# Patient Record
Sex: Female | Born: 1935 | Race: Black or African American | Hispanic: No | Smoking: Never smoker
Health system: Southern US, Community
[De-identification: ages and names within clinical notes are randomized; demographics above are authoritative.]

## PROBLEM LIST (undated history)

## (undated) DIAGNOSIS — I10 Essential (primary) hypertension: Secondary | ICD-10-CM

## (undated) DIAGNOSIS — E7800 Pure hypercholesterolemia, unspecified: Secondary | ICD-10-CM

---

## 2014-01-29 DIAGNOSIS — Z1231 Encounter for screening mammogram for malignant neoplasm of breast: Secondary | ICD-10-CM | POA: Diagnosis not present

## 2014-04-23 DIAGNOSIS — H2513 Age-related nuclear cataract, bilateral: Secondary | ICD-10-CM | POA: Diagnosis not present

## 2014-04-23 DIAGNOSIS — H524 Presbyopia: Secondary | ICD-10-CM | POA: Diagnosis not present

## 2014-04-23 DIAGNOSIS — H02834 Dermatochalasis of left upper eyelid: Secondary | ICD-10-CM | POA: Diagnosis not present

## 2014-04-23 DIAGNOSIS — H02831 Dermatochalasis of right upper eyelid: Secondary | ICD-10-CM | POA: Diagnosis not present

## 2014-04-23 DIAGNOSIS — H40023 Open angle with borderline findings, high risk, bilateral: Secondary | ICD-10-CM | POA: Diagnosis not present

## 2014-05-08 DIAGNOSIS — I10 Essential (primary) hypertension: Secondary | ICD-10-CM | POA: Diagnosis not present

## 2014-05-08 DIAGNOSIS — J9811 Atelectasis: Secondary | ICD-10-CM | POA: Diagnosis not present

## 2014-05-08 DIAGNOSIS — Z79899 Other long term (current) drug therapy: Secondary | ICD-10-CM | POA: Diagnosis not present

## 2014-05-08 DIAGNOSIS — R05 Cough: Secondary | ICD-10-CM | POA: Diagnosis not present

## 2014-05-08 DIAGNOSIS — J40 Bronchitis, not specified as acute or chronic: Secondary | ICD-10-CM | POA: Diagnosis not present

## 2014-05-08 DIAGNOSIS — E785 Hyperlipidemia, unspecified: Secondary | ICD-10-CM | POA: Diagnosis not present

## 2014-05-22 DIAGNOSIS — I10 Essential (primary) hypertension: Secondary | ICD-10-CM | POA: Diagnosis not present

## 2014-05-22 DIAGNOSIS — E782 Mixed hyperlipidemia: Secondary | ICD-10-CM | POA: Diagnosis not present

## 2014-05-22 DIAGNOSIS — R7309 Other abnormal glucose: Secondary | ICD-10-CM | POA: Diagnosis not present

## 2014-10-24 DIAGNOSIS — F064 Anxiety disorder due to known physiological condition: Secondary | ICD-10-CM | POA: Diagnosis not present

## 2014-10-24 DIAGNOSIS — Z0000 Encounter for general adult medical examination without abnormal findings: Secondary | ICD-10-CM | POA: Diagnosis not present

## 2014-10-24 DIAGNOSIS — R7309 Other abnormal glucose: Secondary | ICD-10-CM | POA: Diagnosis not present

## 2014-10-24 DIAGNOSIS — I10 Essential (primary) hypertension: Secondary | ICD-10-CM | POA: Diagnosis not present

## 2014-10-24 DIAGNOSIS — E782 Mixed hyperlipidemia: Secondary | ICD-10-CM | POA: Diagnosis not present

## 2014-11-19 DIAGNOSIS — Z83511 Family history of glaucoma: Secondary | ICD-10-CM | POA: Diagnosis not present

## 2014-11-19 DIAGNOSIS — H5203 Hypermetropia, bilateral: Secondary | ICD-10-CM | POA: Diagnosis not present

## 2014-11-19 DIAGNOSIS — H2513 Age-related nuclear cataract, bilateral: Secondary | ICD-10-CM | POA: Diagnosis not present

## 2014-11-19 DIAGNOSIS — H40023 Open angle with borderline findings, high risk, bilateral: Secondary | ICD-10-CM | POA: Diagnosis not present

## 2014-12-25 DIAGNOSIS — J40 Bronchitis, not specified as acute or chronic: Secondary | ICD-10-CM | POA: Diagnosis not present

## 2015-02-21 DIAGNOSIS — E785 Hyperlipidemia, unspecified: Secondary | ICD-10-CM | POA: Diagnosis not present

## 2015-02-21 DIAGNOSIS — I10 Essential (primary) hypertension: Secondary | ICD-10-CM | POA: Diagnosis not present

## 2015-04-24 DIAGNOSIS — Z79899 Other long term (current) drug therapy: Secondary | ICD-10-CM | POA: Diagnosis not present

## 2015-04-24 DIAGNOSIS — R05 Cough: Secondary | ICD-10-CM | POA: Diagnosis not present

## 2015-04-24 DIAGNOSIS — Z888 Allergy status to other drugs, medicaments and biological substances status: Secondary | ICD-10-CM | POA: Diagnosis not present

## 2015-04-24 DIAGNOSIS — I10 Essential (primary) hypertension: Secondary | ICD-10-CM | POA: Diagnosis not present

## 2015-04-24 DIAGNOSIS — J189 Pneumonia, unspecified organism: Secondary | ICD-10-CM | POA: Diagnosis not present

## 2015-04-27 ENCOUNTER — Emergency Department (HOSPITAL_COMMUNITY): Payer: Medicare Other

## 2015-04-27 ENCOUNTER — Emergency Department (HOSPITAL_COMMUNITY)
Admission: EM | Admit: 2015-04-27 | Discharge: 2015-04-27 | Disposition: A | Discharge status: Home / Self Care | Payer: Medicare Other | Attending: Emergency Medicine | Admitting: Emergency Medicine

## 2015-04-27 ENCOUNTER — Encounter (HOSPITAL_COMMUNITY): Payer: Self-pay

## 2015-04-27 DIAGNOSIS — R600 Localized edema: Secondary | ICD-10-CM | POA: Insufficient documentation

## 2015-04-27 DIAGNOSIS — J40 Bronchitis, not specified as acute or chronic: Secondary | ICD-10-CM | POA: Diagnosis not present

## 2015-04-27 DIAGNOSIS — R05 Cough: Secondary | ICD-10-CM | POA: Diagnosis not present

## 2015-04-27 LAB — BASIC METABOLIC PANEL
ANION GAP: 12 (ref 5–15)
BUN: 16 mg/dL (ref 6–20)
CALCIUM: 9.9 mg/dL (ref 8.9–10.3)
CO2: 21 mmol/L — AB (ref 22–32)
CREATININE: 0.93 mg/dL (ref 0.44–1.00)
Chloride: 101 mmol/L (ref 101–111)
GFR calc Af Amer: >60 mL/min (ref >60)
GFR, EST NON AFRICAN AMERICAN: 57 mL/min — AB (ref >60)
GLUCOSE: 117 mg/dL — AB (ref 65–99)
Potassium: 3.9 mmol/L (ref 3.5–5.1)
Sodium: 134 mmol/L — ABNORMAL LOW (ref 135–145)

## 2015-04-27 LAB — CBC
HEMATOCRIT: 45.1 % (ref 36.0–46.0)
Hemoglobin: 14.7 g/dL (ref 12.0–15.0)
MCH: 28.7 pg (ref 26.0–34.0)
MCHC: 32.6 g/dL (ref 30.0–36.0)
MCV: 87.9 fL (ref 78.0–100.0)
PLATELETS: 365 10*3/uL (ref 150–400)
RBC: 5.13 MIL/uL — ABNORMAL HIGH (ref 3.87–5.11)
RDW: 15.1 % (ref 11.5–15.5)
WBC: 6.7 10*3/uL (ref 4.0–10.5)

## 2015-04-27 LAB — I-STAT TROPONIN, ED: Troponin i, poc: 0.03 ng/mL (ref 0.00–0.08)

## 2015-04-27 MED ORDER — ALBUTEROL SULFATE HFA 108 (90 BASE) MCG/ACT IN AERS
2.0000 | INHALATION_SPRAY | RESPIRATORY_TRACT | Status: DC | PRN
  Administered 2015-04-27: 2 via RESPIRATORY_TRACT
  Filled 2015-04-27: qty 6.7

## 2015-04-27 NOTE — ED Notes (Signed)
Pt placed on 2L [place]

## 2015-04-27 NOTE — ED Notes (Addendum)
Pt here with c/o worsening symptoms after being diagnosed with "a touch of pneumonia" on Thursday at [facility]. She reports worsening cough. She was prescribed azithromycin and prednisone and states she has been taking them without relief. Pt denies having any pain but feels "maybe a discomfort" in her chest when she coughs.

## 2015-04-27 NOTE — ED Notes (Signed)
Pt and family verbalizes understanding of instructions.

## 2015-04-27 NOTE — ED Notes (Signed)
Pt ambulates in  Hallway with steadfy, easy gait and HR stays 90-95. Pulse oximetry remains 93-96% . Pt denies complaint or shortness of breath.

## 2015-04-27 NOTE — Discharge Instructions (Signed)

## 2015-04-27 NOTE — ED Provider Notes (Signed)
CSN: [id number]     Arrival date & time 04/27/15  1102 History   First MD Initiated Contact with Patient 04/27/15 1628     Chief Complaint  Patient presents with  . Pneumonia     (Consider location/radiation/quality/duration/timing/severity/associated sxs/prior Treatment) HPI This is a 81 year old female with report of diagnosis with pneumonia 3 days ago being cheated with Zithromax. She reports 2-1/2 weeks of congestion and cough with some subjective fever. She reports coughing up discolored sputum. She is somewhat dyspneic. Cough has not improved since she has been on medication. She has decreased appetite but is tolerating food and fluids. She reports taking the medication she was prescribed, but the Zithromax and prednisone. History reviewed. No pertinent past medical history. History reviewed. No pertinent past surgical history. No family history on file. Social History  Substance Use Topics  . Smoking status: Never Smoker   . Smokeless tobacco: None  . Alcohol Use: No   OB History    No data available     Review of Systems  All other systems reviewed and are negative.     Allergies  Review of patient's allergies indicates not on file.  Home Medications   Prior to Admission medications   Not on File   BP 125/78 mmHg  Pulse 114  Temp(Src) 99.3 F (37.4 C) (Oral)  Resp 25  SpO2 88% Physical Exam  Constitutional: She is oriented to person, place, and time. She appears well-developed and well-nourished.  HENT:  Head: Normocephalic and atraumatic.  Right Ear: External ear normal.  Left Ear: External ear normal.  Nose: Nose normal.  Mouth/Throat: Oropharynx is clear and moist.  Eyes: Conjunctivae and EOM are normal. Pupils are equal, round, and reactive to light.  Neck: Normal range of motion. Neck supple.  Cardiovascular: Normal rate, regular rhythm, normal heart sounds and intact distal pulses.   Pulmonary/Chest: Effort normal and breath sounds normal.   Abdominal: Soft. Bowel sounds are normal.  Musculoskeletal: Normal range of motion. She exhibits edema.  Trace edema bilaterally  Neurological: She is alert and oriented to person, place, and time. She has normal reflexes.  Skin: Skin is warm and dry.  Psychiatric: She has a normal mood and affect. Her behavior is normal. Judgment and thought content normal.  Nursing note and vitals reviewed.   ED Course  Procedures (including critical care time) Labs Review Labs Reviewed  BASIC METABOLIC PANEL - Abnormal; Notable for the following:    Sodium 134 (*)    CO2 21 (*)    Glucose, Bld 117 (*)    GFR calc non Af Amer 57 (*)    All other components within normal limits  CBC - Abnormal; Notable for the following:    RBC 5.13 (*)    All other components within normal limits  Rosezena Sensor, ED    Imaging Review Dg Chest 2 View  04/27/2015  CLINICAL DATA:  Worsening cough.  Pneumonia? EXAM: CHEST  2 VIEW COMPARISON:  None. FINDINGS: The heart size and mediastinal contours are within normal limits. Both lungs are clear. The visualized skeletal structures are unremarkable. IMPRESSION: No active cardiopulmonary disease.  No evidence of pneumonia seen. Electronically Signed   By: Bary Richard M.D.   On: 04/27/2015 13:21   I have personally reviewed and evaluated these images and lab results as part of my medical decision-making.   EKG Interpretation None      MDM   Final diagnoses:  Bronchitis    This is a 81 year old  female who presents stating that she was diagnosed with pneumonia total days ago. She has continued to cough but has not felt dyspneic and has not had a fever at home. Here her chest x-Serah Nicoletti is clear. There is a one-time sat of 88% noted from triage. On the pulse oximeter in the bed her sats of been in the upper 90s as high as 100%. She was ambulated and sats were between 90 and 95%. She's given an albuterol HFA and has decreased coughing here with this. Plan continue  Zithromax and home prednisone  Margarita Grizzleanielle Kassidie Hendriks, MD 04/27/15 [id number]

## 2015-05-14 DIAGNOSIS — Z1231 Encounter for screening mammogram for malignant neoplasm of breast: Secondary | ICD-10-CM | POA: Diagnosis not present

## 2015-05-22 DIAGNOSIS — H40023 Open angle with borderline findings, high risk, bilateral: Secondary | ICD-10-CM | POA: Diagnosis not present

## 2015-05-22 DIAGNOSIS — H2513 Age-related nuclear cataract, bilateral: Secondary | ICD-10-CM | POA: Diagnosis not present

## 2015-05-22 DIAGNOSIS — H02831 Dermatochalasis of right upper eyelid: Secondary | ICD-10-CM | POA: Diagnosis not present

## 2015-05-22 DIAGNOSIS — H25013 Cortical age-related cataract, bilateral: Secondary | ICD-10-CM | POA: Diagnosis not present

## 2015-05-22 DIAGNOSIS — H5203 Hypermetropia, bilateral: Secondary | ICD-10-CM | POA: Diagnosis not present

## 2015-06-26 DIAGNOSIS — H2511 Age-related nuclear cataract, right eye: Secondary | ICD-10-CM | POA: Diagnosis not present

## 2015-06-26 DIAGNOSIS — H2589 Other age-related cataract: Secondary | ICD-10-CM | POA: Diagnosis not present

## 2015-06-26 DIAGNOSIS — H25011 Cortical age-related cataract, right eye: Secondary | ICD-10-CM | POA: Diagnosis not present

## 2015-07-22 DIAGNOSIS — E785 Hyperlipidemia, unspecified: Secondary | ICD-10-CM | POA: Diagnosis not present

## 2015-07-22 DIAGNOSIS — I10 Essential (primary) hypertension: Secondary | ICD-10-CM | POA: Diagnosis not present

## 2015-07-22 DIAGNOSIS — R7309 Other abnormal glucose: Secondary | ICD-10-CM | POA: Diagnosis not present

## 2015-07-22 DIAGNOSIS — Z0000 Encounter for general adult medical examination without abnormal findings: Secondary | ICD-10-CM | POA: Diagnosis not present

## 2015-09-01 DIAGNOSIS — H5203 Hypermetropia, bilateral: Secondary | ICD-10-CM | POA: Diagnosis not present

## 2015-09-01 DIAGNOSIS — H25012 Cortical age-related cataract, left eye: Secondary | ICD-10-CM | POA: Diagnosis not present

## 2015-09-01 DIAGNOSIS — H2512 Age-related nuclear cataract, left eye: Secondary | ICD-10-CM | POA: Diagnosis not present

## 2015-09-17 DIAGNOSIS — H2011 Chronic iridocyclitis, right eye: Secondary | ICD-10-CM | POA: Diagnosis not present

## 2015-10-07 DIAGNOSIS — H2011 Chronic iridocyclitis, right eye: Secondary | ICD-10-CM | POA: Diagnosis not present

## 2015-10-07 DIAGNOSIS — H40051 Ocular hypertension, right eye: Secondary | ICD-10-CM | POA: Diagnosis not present

## 2015-10-14 DIAGNOSIS — H2011 Chronic iridocyclitis, right eye: Secondary | ICD-10-CM | POA: Diagnosis not present

## 2015-11-07 DIAGNOSIS — H2011 Chronic iridocyclitis, right eye: Secondary | ICD-10-CM | POA: Diagnosis not present

## 2015-11-19 DIAGNOSIS — I169 Hypertensive crisis, unspecified: Secondary | ICD-10-CM | POA: Diagnosis not present

## 2015-11-19 DIAGNOSIS — E782 Mixed hyperlipidemia: Secondary | ICD-10-CM | POA: Diagnosis not present

## 2015-11-19 DIAGNOSIS — Z79899 Other long term (current) drug therapy: Secondary | ICD-10-CM | POA: Diagnosis not present

## 2015-11-19 DIAGNOSIS — I10 Essential (primary) hypertension: Secondary | ICD-10-CM | POA: Diagnosis not present

## 2015-11-24 DIAGNOSIS — E782 Mixed hyperlipidemia: Secondary | ICD-10-CM | POA: Diagnosis not present

## 2015-11-24 DIAGNOSIS — I10 Essential (primary) hypertension: Secondary | ICD-10-CM | POA: Diagnosis not present

## 2015-11-24 DIAGNOSIS — I110 Hypertensive heart disease with heart failure: Secondary | ICD-10-CM | POA: Diagnosis not present

## 2015-12-02 DIAGNOSIS — H2011 Chronic iridocyclitis, right eye: Secondary | ICD-10-CM | POA: Diagnosis not present

## 2015-12-02 DIAGNOSIS — I119 Hypertensive heart disease without heart failure: Secondary | ICD-10-CM | POA: Diagnosis not present

## 2015-12-02 DIAGNOSIS — I110 Hypertensive heart disease with heart failure: Secondary | ICD-10-CM | POA: Diagnosis not present

## 2015-12-03 DIAGNOSIS — I110 Hypertensive heart disease with heart failure: Secondary | ICD-10-CM | POA: Diagnosis not present

## 2015-12-11 DIAGNOSIS — R350 Frequency of micturition: Secondary | ICD-10-CM | POA: Diagnosis not present

## 2015-12-17 DIAGNOSIS — H2011 Chronic iridocyclitis, right eye: Secondary | ICD-10-CM | POA: Diagnosis not present

## 2015-12-17 DIAGNOSIS — H2512 Age-related nuclear cataract, left eye: Secondary | ICD-10-CM | POA: Diagnosis not present

## 2015-12-22 DIAGNOSIS — I110 Hypertensive heart disease with heart failure: Secondary | ICD-10-CM | POA: Diagnosis not present

## 2015-12-22 DIAGNOSIS — G5601 Carpal tunnel syndrome, right upper limb: Secondary | ICD-10-CM | POA: Diagnosis not present

## 2015-12-22 DIAGNOSIS — E782 Mixed hyperlipidemia: Secondary | ICD-10-CM | POA: Diagnosis not present

## 2016-01-03 ENCOUNTER — Emergency Department (HOSPITAL_COMMUNITY)
Admission: EM | Admit: 2016-01-03 | Discharge: 2016-01-03 | Disposition: A | Discharge status: Home / Self Care | Payer: Medicare Other | Attending: Emergency Medicine | Admitting: Emergency Medicine

## 2016-01-03 ENCOUNTER — Encounter (HOSPITAL_COMMUNITY): Payer: Self-pay

## 2016-01-03 DIAGNOSIS — R350 Frequency of micturition: Secondary | ICD-10-CM | POA: Insufficient documentation

## 2016-01-03 LAB — URINALYSIS, ROUTINE W REFLEX MICROSCOPIC
Bilirubin Urine: NEGATIVE
Glucose, UA: NEGATIVE mg/dL
Hgb urine dipstick: NEGATIVE
Ketones, ur: NEGATIVE mg/dL
Nitrite: NEGATIVE
Protein, ur: NEGATIVE mg/dL
Specific Gravity, Urine: 1.023 (ref 1.005–1.030)
pH: 5 (ref 5.0–8.0)

## 2016-01-03 LAB — CBG MONITORING, ED: Glucose-Capillary: 124 mg/dL — ABNORMAL HIGH (ref 65–99)

## 2016-01-03 MED ORDER — MIRABEGRON ER 50 MG PO TB24: 50.0000 mg | ORAL_TABLET | Freq: Every day | ORAL | 0 refills | Status: DC

## 2016-01-03 NOTE — ED Triage Notes (Signed)
Patient here with urinary frequency fr several days. Denies dysuria, denies cloudy urine. No distress

## 2016-01-03 NOTE — ED Provider Notes (Signed)
[facility]-EMERGENCY DEPT Provider Note   CSN: [id number] Arrival date & time: 01/03/16  1244  By signing my name below, I, Rosario AdieWilliam Andrew Hiatt, attest that this documentation has been prepared under the direction and in the presence of Raeford RazorStephen Fatuma Dowers, MD. Electronically Signed: Rosario AdieWilliam Andrew Hiatt, ED Scribe. 01/03/16. 2:12 PM.  History   Chief Complaint Chief Complaint  Patient presents with  . Urinary Frequency   The history is provided by the patient. No language interpreter was used.    HPI Comments: Kelly Dodson is a 81 y.o. female with a PMHx of CHF, who presents to the Emergency Department complaining of gradually worsening urinary frequency onset approximately 2 months ago. She notes that she has been having urine voids every two hours, prompting her to come into the ED. She was seen by her PCP for her symptoms, at that time her diuretic medications were altered. Pt has been taking these new medications without relief and she notes that they have now made her symptoms worse. There are no other associated symptoms or complaints. She denies dysuria, hematuria, abdominal pain, or any other associated symptoms.   PCP/Cardiologist: Geraldo PitterBLAND,VEITA J, MD  History reviewed. No pertinent past medical history.  There are no active problems to display for this patient.  History reviewed. No pertinent surgical history.  OB History    No data available     Home Medications    Prior to Admission medications   Medication Sig Start Date End Date Taking? Authorizing Provider  amLODipine (NORVASC) 10 MG tablet Take 10 mg by mouth daily. 06/25/04   Historical Provider, MD  azithromycin (ZITHROMAX) 250 MG tablet Take 250 mg by mouth daily. 04/24/15   Historical Provider, MD  bumetanide (BUMEX) 0.5 MG tablet Take 0.5 mg by mouth daily.    Historical Provider, MD  DM-Doxylamine-Acetaminophen (NYQUIL COLD & FLU PO) Take 2 capsules by mouth daily as needed (FOR COLD).    Historical Provider, MD    ezetimibe (ZETIA) 10 MG tablet Take 10 mg by mouth daily. 06/01/04   Historical Provider, MD  guaiFENesin (MUCINEX) 600 MG 12 hr tablet Take 600 mg by mouth 2 (two) times daily as needed for cough or to loosen phlegm.    Historical Provider, MD  Multiple Vitamin (MULTIVITAMIN WITH MINERALS) TABS tablet Take 1 tablet by mouth daily.    Historical Provider, MD  predniSONE (DELTASONE) 10 MG tablet Take 10 mg by mouth daily. 04/24/15   Historical Provider, MD   Family History No family history on file.  Social History Social History  Substance Use Topics  . Smoking status: Never Smoker  . Smokeless tobacco: Not on file  . Alcohol use No   Allergies   Patient has no known allergies.  Review of Systems Review of Systems  Constitutional: Negative for fever.  Gastrointestinal: Negative for abdominal pain.  Genitourinary: Positive for frequency. Negative for difficulty urinating, dysuria, hematuria and urgency.  Psychiatric/Behavioral: Negative for confusion.  All other systems reviewed and are negative.  Physical Exam Updated Vital Signs BP 142/88 (BP Location: Right Arm)   Pulse 107   Temp 98.1 F (36.7 C) (Oral)   Resp 18   SpO2 94%   Physical Exam  Constitutional: She appears well-developed and well-nourished.  HENT:  Head: Normocephalic.  Right Ear: External ear normal.  Left Ear: External ear normal.  Nose: Nose normal.  Mouth/Throat: Oropharynx is clear and moist.  Eyes: Conjunctivae are normal. Right eye exhibits no discharge. Left eye exhibits no discharge.  Neck: Normal range of motion.  Cardiovascular: Normal rate, regular rhythm and normal heart sounds.   No murmur heard. Pulmonary/Chest: Effort normal and breath sounds normal. No respiratory distress. She has no wheezes. She has no rales.  Abdominal: Soft. She exhibits no distension. There is no tenderness. There is no rebound and no guarding.  Musculoskeletal: Normal range of motion. She exhibits no edema or  tenderness.  Neurological: She is alert. No cranial nerve deficit. Coordination normal.  Skin: Skin is warm and dry. No rash noted. No erythema.  Psychiatric: She has a normal mood and affect. Her behavior is normal.  Nursing note and vitals reviewed.  ED Treatments / Results  DIAGNOSTIC STUDIES: Oxygen Saturation is 94% on RA, adequate by my interpretation.   COORDINATION OF CARE: 2:12 PM-Discussed next steps with pt. Pt verbalized understanding and is agreeable with the plan.   Labs (all labs ordered are listed, but only abnormal results are displayed) Labs Reviewed  CBG MONITORING, ED - Abnormal; Notable for the following:       Result Value   Glucose-Capillary 124 (*)    All other components within normal limits  URINALYSIS, ROUTINE W REFLEX MICROSCOPIC   EKG  EKG Interpretation None      Radiology No results found.  Procedures Procedures   Medications Ordered in ED Medications - No data to display  Initial Impression / Assessment and Plan / ED Course  I have reviewed the triage vital signs and the nursing notes.  Pertinent labs & imaging results that were available during my care of the patient were reviewed by me and considered in my medical decision making (see chart for details).  Clinical Course      Final Clinical Impressions(s) / ED Diagnoses   Final diagnoses:  Increased urinary frequency   81yF with increased urinary frequency. Some confusion in terms of her diuretic. She is not sure if she is still taking bumex or not. Also recently prescribed mirabegron but didn't fill. UA not consistent with UTI. PCP FU.   New Prescriptions New Prescriptions   No medications on file   I personally preformed the services scribed in my presence. The recorded information has been reviewed is accurate. Raeford RazorStephen Theresia Pree, MD.     Raeford RazorStephen Keyaria Lawson, MD 01/07/16 [phone]

## 2016-01-03 NOTE — Discharge Instructions (Signed)
Your increased urinary frequency has likely related to one of your medications (bumex/bumetanide). This medication as a diuretic (water pill). It works on her kidneys and makes you urinate more frequently. Your urinary frequency is not from a urinary tract infection. He could try taking this medication as early as she can when you get up and this may decrease amount of urinary frequency when you're trying to sleep at night. Otherwise, need to discuss her medications with her PCP and adjustments are more appropriate to be made by them.

## 2016-01-03 NOTE — ED Notes (Signed)
Unable to provide urine specimen at triage 

## 2016-01-03 NOTE — ED Notes (Signed)
CBG 124 

## 2016-01-07 DIAGNOSIS — H2011 Chronic iridocyclitis, right eye: Secondary | ICD-10-CM | POA: Diagnosis not present

## 2016-01-27 DIAGNOSIS — G5601 Carpal tunnel syndrome, right upper limb: Secondary | ICD-10-CM | POA: Diagnosis not present

## 2016-01-27 DIAGNOSIS — R7309 Other abnormal glucose: Secondary | ICD-10-CM | POA: Diagnosis not present

## 2016-01-27 DIAGNOSIS — I110 Hypertensive heart disease with heart failure: Secondary | ICD-10-CM | POA: Diagnosis not present

## 2016-01-28 DIAGNOSIS — H2011 Chronic iridocyclitis, right eye: Secondary | ICD-10-CM | POA: Diagnosis not present

## 2016-01-28 DIAGNOSIS — H5203 Hypermetropia, bilateral: Secondary | ICD-10-CM | POA: Diagnosis not present

## 2016-01-28 DIAGNOSIS — H524 Presbyopia: Secondary | ICD-10-CM | POA: Diagnosis not present

## 2016-02-26 DIAGNOSIS — G542 Cervical root disorders, not elsewhere classified: Secondary | ICD-10-CM | POA: Diagnosis not present

## 2016-02-26 DIAGNOSIS — M9902 Segmental and somatic dysfunction of thoracic region: Secondary | ICD-10-CM | POA: Diagnosis not present

## 2016-02-26 DIAGNOSIS — M9903 Segmental and somatic dysfunction of lumbar region: Secondary | ICD-10-CM | POA: Diagnosis not present

## 2016-02-26 DIAGNOSIS — M9901 Segmental and somatic dysfunction of cervical region: Secondary | ICD-10-CM | POA: Diagnosis not present

## 2016-02-27 DIAGNOSIS — M9903 Segmental and somatic dysfunction of lumbar region: Secondary | ICD-10-CM | POA: Diagnosis not present

## 2016-02-27 DIAGNOSIS — G542 Cervical root disorders, not elsewhere classified: Secondary | ICD-10-CM | POA: Diagnosis not present

## 2016-02-27 DIAGNOSIS — M9901 Segmental and somatic dysfunction of cervical region: Secondary | ICD-10-CM | POA: Diagnosis not present

## 2016-02-27 DIAGNOSIS — M9902 Segmental and somatic dysfunction of thoracic region: Secondary | ICD-10-CM | POA: Diagnosis not present

## 2016-03-01 DIAGNOSIS — M9902 Segmental and somatic dysfunction of thoracic region: Secondary | ICD-10-CM | POA: Diagnosis not present

## 2016-03-01 DIAGNOSIS — M9903 Segmental and somatic dysfunction of lumbar region: Secondary | ICD-10-CM | POA: Diagnosis not present

## 2016-03-01 DIAGNOSIS — G542 Cervical root disorders, not elsewhere classified: Secondary | ICD-10-CM | POA: Diagnosis not present

## 2016-03-01 DIAGNOSIS — M9901 Segmental and somatic dysfunction of cervical region: Secondary | ICD-10-CM | POA: Diagnosis not present

## 2016-03-03 DIAGNOSIS — G542 Cervical root disorders, not elsewhere classified: Secondary | ICD-10-CM | POA: Diagnosis not present

## 2016-03-03 DIAGNOSIS — M9901 Segmental and somatic dysfunction of cervical region: Secondary | ICD-10-CM | POA: Diagnosis not present

## 2016-03-03 DIAGNOSIS — M9903 Segmental and somatic dysfunction of lumbar region: Secondary | ICD-10-CM | POA: Diagnosis not present

## 2016-03-03 DIAGNOSIS — M9902 Segmental and somatic dysfunction of thoracic region: Secondary | ICD-10-CM | POA: Diagnosis not present

## 2016-03-04 DIAGNOSIS — M9902 Segmental and somatic dysfunction of thoracic region: Secondary | ICD-10-CM | POA: Diagnosis not present

## 2016-03-04 DIAGNOSIS — M9903 Segmental and somatic dysfunction of lumbar region: Secondary | ICD-10-CM | POA: Diagnosis not present

## 2016-03-04 DIAGNOSIS — G542 Cervical root disorders, not elsewhere classified: Secondary | ICD-10-CM | POA: Diagnosis not present

## 2016-03-04 DIAGNOSIS — M9901 Segmental and somatic dysfunction of cervical region: Secondary | ICD-10-CM | POA: Diagnosis not present

## 2016-03-05 DIAGNOSIS — M9902 Segmental and somatic dysfunction of thoracic region: Secondary | ICD-10-CM | POA: Diagnosis not present

## 2016-03-05 DIAGNOSIS — G542 Cervical root disorders, not elsewhere classified: Secondary | ICD-10-CM | POA: Diagnosis not present

## 2016-03-05 DIAGNOSIS — M9901 Segmental and somatic dysfunction of cervical region: Secondary | ICD-10-CM | POA: Diagnosis not present

## 2016-03-05 DIAGNOSIS — M9903 Segmental and somatic dysfunction of lumbar region: Secondary | ICD-10-CM | POA: Diagnosis not present

## 2016-03-08 DIAGNOSIS — M9902 Segmental and somatic dysfunction of thoracic region: Secondary | ICD-10-CM | POA: Diagnosis not present

## 2016-03-08 DIAGNOSIS — M9903 Segmental and somatic dysfunction of lumbar region: Secondary | ICD-10-CM | POA: Diagnosis not present

## 2016-03-08 DIAGNOSIS — M9901 Segmental and somatic dysfunction of cervical region: Secondary | ICD-10-CM | POA: Diagnosis not present

## 2016-03-08 DIAGNOSIS — G542 Cervical root disorders, not elsewhere classified: Secondary | ICD-10-CM | POA: Diagnosis not present

## 2016-03-09 DIAGNOSIS — M9901 Segmental and somatic dysfunction of cervical region: Secondary | ICD-10-CM | POA: Diagnosis not present

## 2016-03-09 DIAGNOSIS — M9902 Segmental and somatic dysfunction of thoracic region: Secondary | ICD-10-CM | POA: Diagnosis not present

## 2016-03-09 DIAGNOSIS — G542 Cervical root disorders, not elsewhere classified: Secondary | ICD-10-CM | POA: Diagnosis not present

## 2016-03-09 DIAGNOSIS — M9903 Segmental and somatic dysfunction of lumbar region: Secondary | ICD-10-CM | POA: Diagnosis not present

## 2016-04-27 DIAGNOSIS — E782 Mixed hyperlipidemia: Secondary | ICD-10-CM | POA: Diagnosis not present

## 2016-04-27 DIAGNOSIS — I10 Essential (primary) hypertension: Secondary | ICD-10-CM | POA: Diagnosis not present

## 2016-05-18 IMAGING — DX DG CHEST 2V
2 series · 2 of 2 positions shown · non-contrast
Comparison: None.

CLINICAL DATA: Worsening cough.  Pneumonia?

EXAM:
CHEST  2 VIEW

[chest pa]
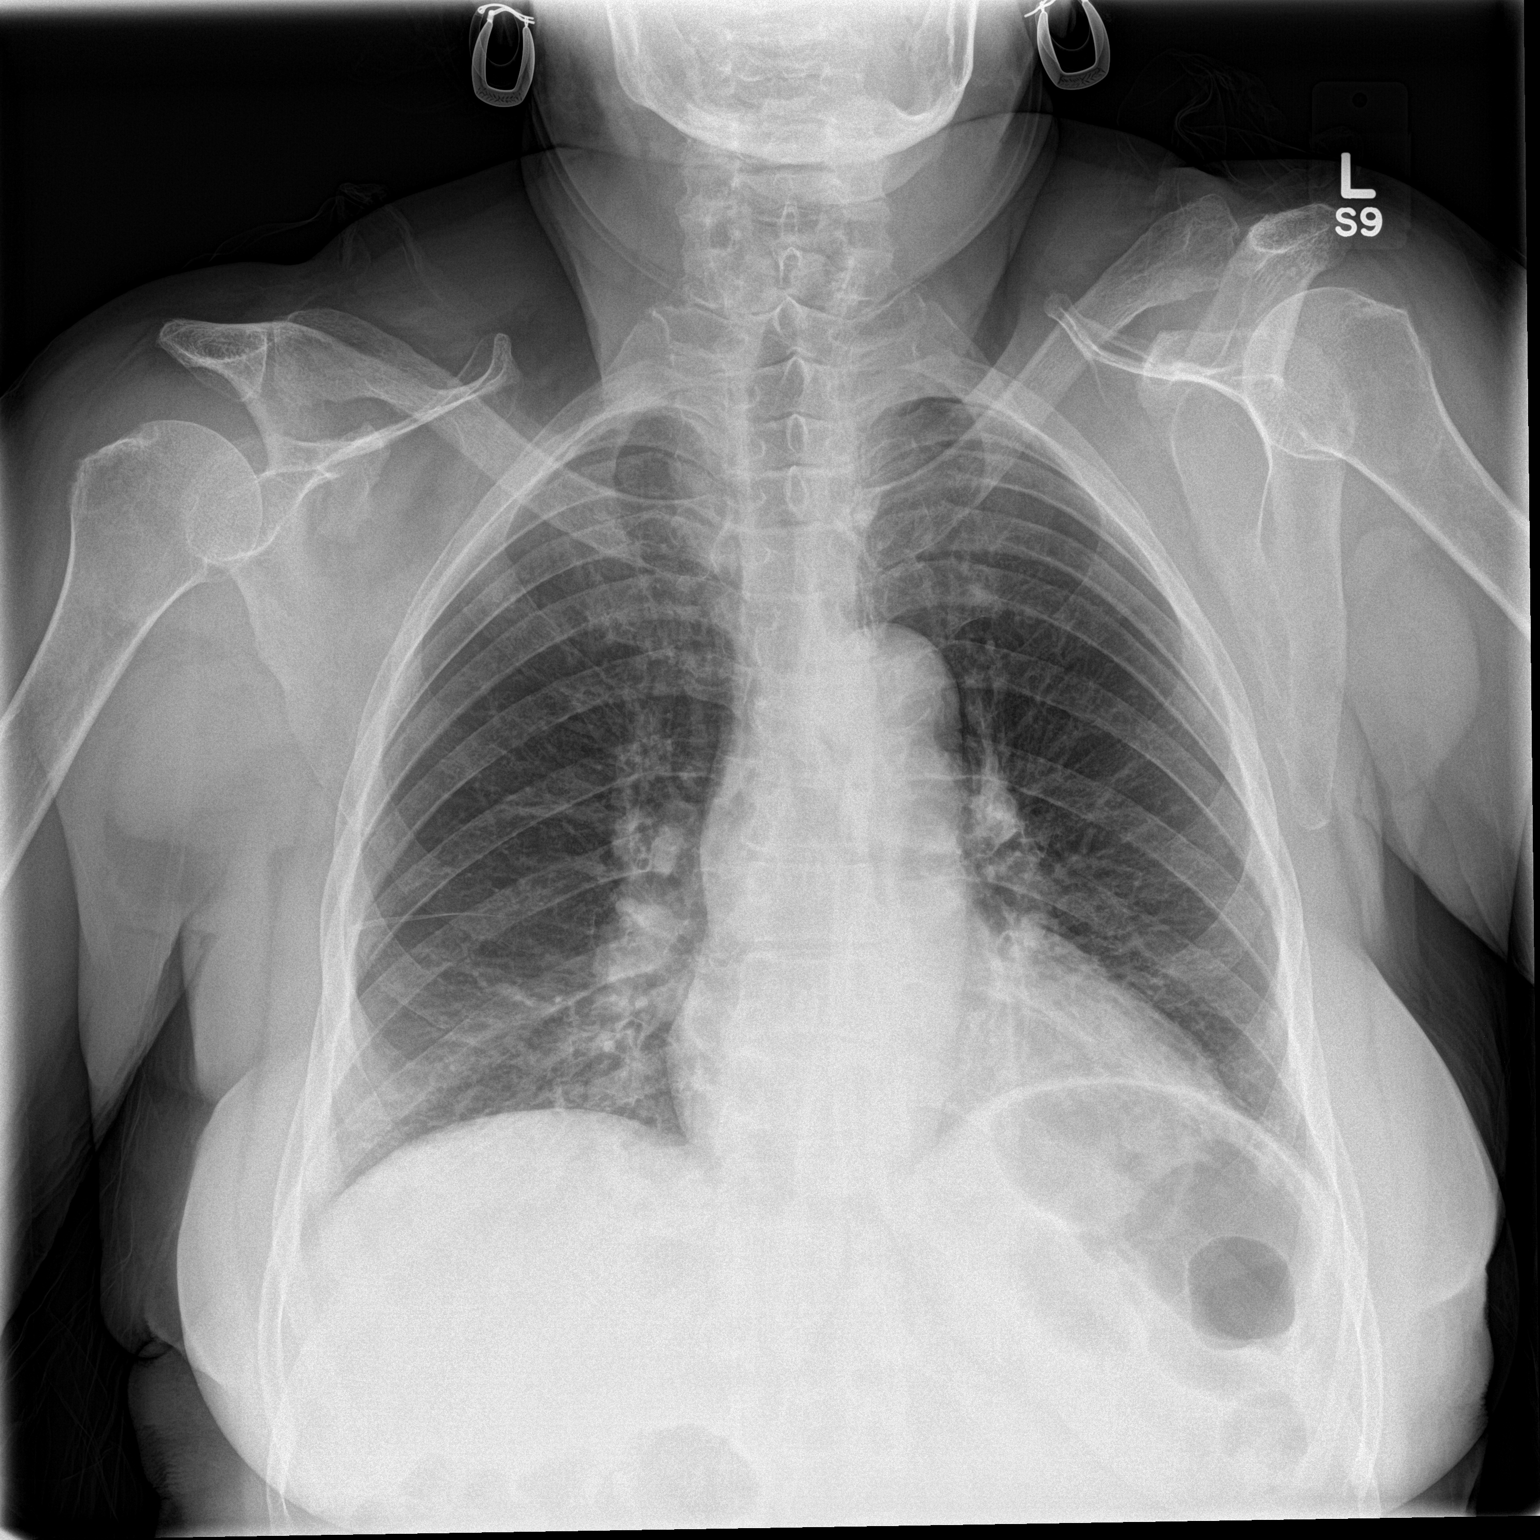

[chest lat]
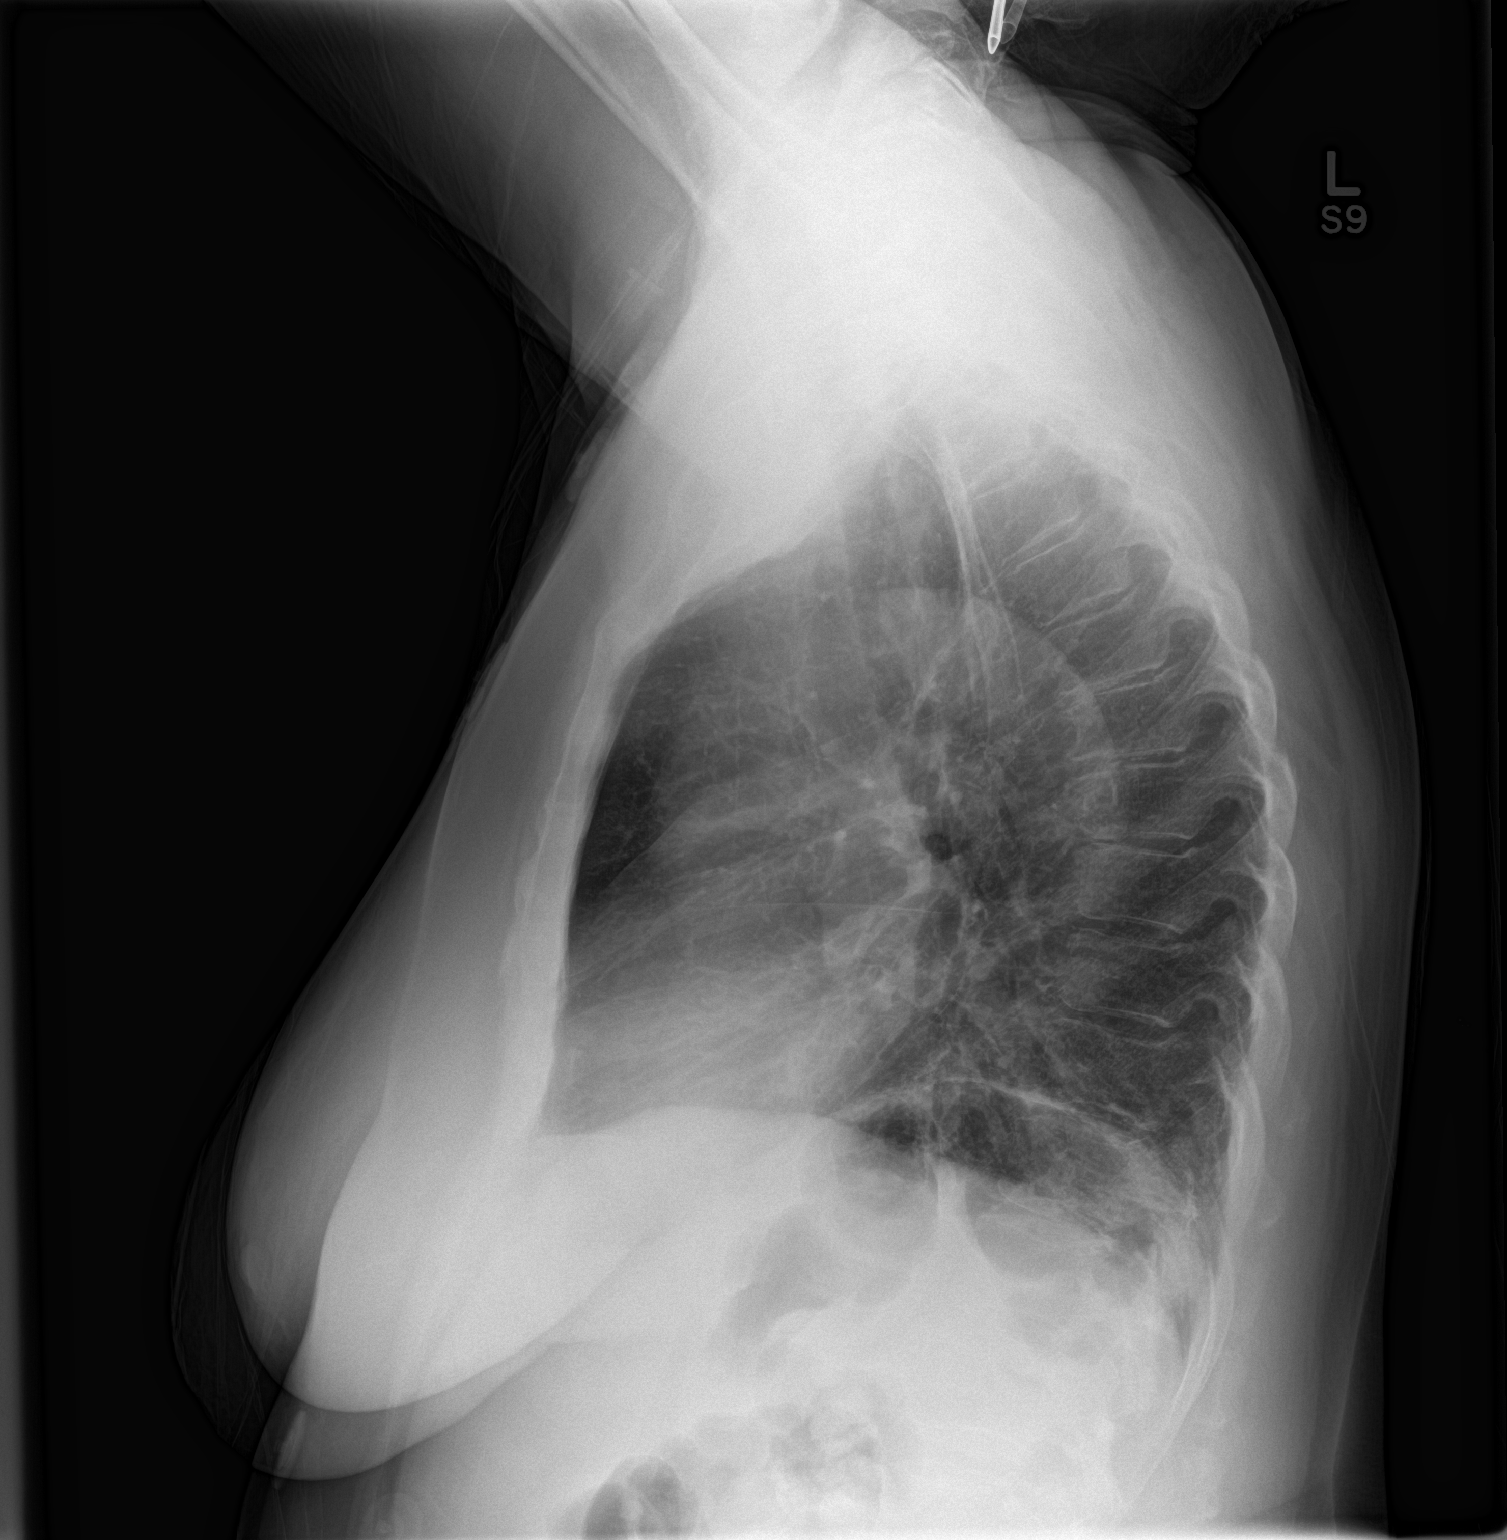

[2 of 2 positions shown; findings below may reference images not displayed]

FINDINGS: The heart size and mediastinal contours are within normal limits.
Both lungs are clear. The visualized skeletal structures are
unremarkable.
IMPRESSION: No active cardiopulmonary disease.  No evidence of pneumonia seen.

## 2016-05-25 DIAGNOSIS — Z961 Presence of intraocular lens: Secondary | ICD-10-CM | POA: Diagnosis not present

## 2016-05-25 DIAGNOSIS — H52203 Unspecified astigmatism, bilateral: Secondary | ICD-10-CM | POA: Diagnosis not present

## 2016-05-25 DIAGNOSIS — H40023 Open angle with borderline findings, high risk, bilateral: Secondary | ICD-10-CM | POA: Diagnosis not present

## 2016-05-25 DIAGNOSIS — H5203 Hypermetropia, bilateral: Secondary | ICD-10-CM | POA: Diagnosis not present

## 2016-05-25 DIAGNOSIS — H2512 Age-related nuclear cataract, left eye: Secondary | ICD-10-CM | POA: Diagnosis not present

## 2016-08-27 DIAGNOSIS — I10 Essential (primary) hypertension: Secondary | ICD-10-CM | POA: Diagnosis not present

## 2016-11-16 DIAGNOSIS — N8110 Cystocele, unspecified: Secondary | ICD-10-CM | POA: Diagnosis not present

## 2016-11-30 DIAGNOSIS — H40023 Open angle with borderline findings, high risk, bilateral: Secondary | ICD-10-CM | POA: Diagnosis not present

## 2016-11-30 DIAGNOSIS — H52203 Unspecified astigmatism, bilateral: Secondary | ICD-10-CM | POA: Diagnosis not present

## 2016-11-30 DIAGNOSIS — H2512 Age-related nuclear cataract, left eye: Secondary | ICD-10-CM | POA: Diagnosis not present

## 2016-11-30 DIAGNOSIS — H524 Presbyopia: Secondary | ICD-10-CM | POA: Diagnosis not present

## 2016-11-30 DIAGNOSIS — Z83511 Family history of glaucoma: Secondary | ICD-10-CM | POA: Diagnosis not present

## 2017-02-15 DIAGNOSIS — E785 Hyperlipidemia, unspecified: Secondary | ICD-10-CM | POA: Diagnosis not present

## 2017-02-15 DIAGNOSIS — R350 Frequency of micturition: Secondary | ICD-10-CM | POA: Diagnosis not present

## 2017-06-03 DIAGNOSIS — Z1231 Encounter for screening mammogram for malignant neoplasm of breast: Secondary | ICD-10-CM | POA: Diagnosis not present

## 2017-06-15 DIAGNOSIS — E782 Mixed hyperlipidemia: Secondary | ICD-10-CM | POA: Diagnosis not present

## 2017-06-15 DIAGNOSIS — I10 Essential (primary) hypertension: Secondary | ICD-10-CM | POA: Diagnosis not present

## 2017-06-23 DIAGNOSIS — N819 Female genital prolapse, unspecified: Secondary | ICD-10-CM | POA: Diagnosis not present

## 2017-07-07 DIAGNOSIS — N819 Female genital prolapse, unspecified: Secondary | ICD-10-CM | POA: Diagnosis not present

## 2017-07-14 DIAGNOSIS — Z961 Presence of intraocular lens: Secondary | ICD-10-CM | POA: Diagnosis not present

## 2017-07-14 DIAGNOSIS — H43393 Other vitreous opacities, bilateral: Secondary | ICD-10-CM | POA: Diagnosis not present

## 2017-07-14 DIAGNOSIS — H268 Other specified cataract: Secondary | ICD-10-CM | POA: Diagnosis not present

## 2017-07-14 DIAGNOSIS — H40023 Open angle with borderline findings, high risk, bilateral: Secondary | ICD-10-CM | POA: Diagnosis not present

## 2017-07-14 DIAGNOSIS — H2512 Age-related nuclear cataract, left eye: Secondary | ICD-10-CM | POA: Diagnosis not present

## 2017-08-04 DIAGNOSIS — N8110 Cystocele, unspecified: Secondary | ICD-10-CM | POA: Diagnosis not present

## 2017-08-26 DIAGNOSIS — E785 Hyperlipidemia, unspecified: Secondary | ICD-10-CM | POA: Diagnosis not present

## 2017-08-26 DIAGNOSIS — I10 Essential (primary) hypertension: Secondary | ICD-10-CM | POA: Diagnosis not present

## 2017-11-10 DIAGNOSIS — N819 Female genital prolapse, unspecified: Secondary | ICD-10-CM | POA: Diagnosis not present

## 2018-01-27 DIAGNOSIS — R7309 Other abnormal glucose: Secondary | ICD-10-CM | POA: Diagnosis not present

## 2018-01-27 DIAGNOSIS — Z0000 Encounter for general adult medical examination without abnormal findings: Secondary | ICD-10-CM | POA: Diagnosis not present

## 2018-01-27 DIAGNOSIS — E785 Hyperlipidemia, unspecified: Secondary | ICD-10-CM | POA: Diagnosis not present

## 2018-01-27 DIAGNOSIS — I10 Essential (primary) hypertension: Secondary | ICD-10-CM | POA: Diagnosis not present

## 2018-02-10 DIAGNOSIS — I10 Essential (primary) hypertension: Secondary | ICD-10-CM | POA: Diagnosis not present

## 2018-02-10 DIAGNOSIS — E785 Hyperlipidemia, unspecified: Secondary | ICD-10-CM | POA: Diagnosis not present

## 2018-02-15 DIAGNOSIS — R300 Dysuria: Secondary | ICD-10-CM | POA: Diagnosis not present

## 2018-02-15 DIAGNOSIS — B9620 Unspecified Escherichia coli [E. coli] as the cause of diseases classified elsewhere: Secondary | ICD-10-CM | POA: Diagnosis not present

## 2018-02-15 DIAGNOSIS — N3000 Acute cystitis without hematuria: Secondary | ICD-10-CM | POA: Diagnosis not present

## 2018-02-20 DIAGNOSIS — H40023 Open angle with borderline findings, high risk, bilateral: Secondary | ICD-10-CM | POA: Diagnosis not present

## 2018-02-20 DIAGNOSIS — H268 Other specified cataract: Secondary | ICD-10-CM | POA: Diagnosis not present

## 2018-02-20 DIAGNOSIS — Z83511 Family history of glaucoma: Secondary | ICD-10-CM | POA: Diagnosis not present

## 2018-02-20 DIAGNOSIS — H2512 Age-related nuclear cataract, left eye: Secondary | ICD-10-CM | POA: Diagnosis not present

## 2018-04-03 DIAGNOSIS — R350 Frequency of micturition: Secondary | ICD-10-CM | POA: Diagnosis not present

## 2018-06-07 DIAGNOSIS — Z79899 Other long term (current) drug therapy: Secondary | ICD-10-CM | POA: Diagnosis not present

## 2018-06-07 DIAGNOSIS — R430 Anosmia: Secondary | ICD-10-CM | POA: Diagnosis not present

## 2018-06-07 DIAGNOSIS — H524 Presbyopia: Secondary | ICD-10-CM | POA: Diagnosis not present

## 2018-06-16 DIAGNOSIS — E785 Hyperlipidemia, unspecified: Secondary | ICD-10-CM | POA: Diagnosis not present

## 2018-06-16 DIAGNOSIS — I10 Essential (primary) hypertension: Secondary | ICD-10-CM | POA: Diagnosis not present

## 2018-06-16 DIAGNOSIS — E1169 Type 2 diabetes mellitus with other specified complication: Secondary | ICD-10-CM | POA: Diagnosis not present

## 2018-06-16 DIAGNOSIS — E782 Mixed hyperlipidemia: Secondary | ICD-10-CM | POA: Diagnosis not present

## 2018-06-23 DIAGNOSIS — R430 Anosmia: Secondary | ICD-10-CM | POA: Diagnosis not present

## 2018-06-23 DIAGNOSIS — R439 Unspecified disturbances of smell and taste: Secondary | ICD-10-CM | POA: Diagnosis not present

## 2018-06-23 DIAGNOSIS — I6782 Cerebral ischemia: Secondary | ICD-10-CM | POA: Diagnosis not present

## 2018-06-29 DIAGNOSIS — H903 Sensorineural hearing loss, bilateral: Secondary | ICD-10-CM | POA: Diagnosis not present

## 2018-10-17 DIAGNOSIS — R7309 Other abnormal glucose: Secondary | ICD-10-CM | POA: Diagnosis not present

## 2018-10-17 DIAGNOSIS — E782 Mixed hyperlipidemia: Secondary | ICD-10-CM | POA: Diagnosis not present

## 2018-10-17 DIAGNOSIS — R7301 Impaired fasting glucose: Secondary | ICD-10-CM | POA: Diagnosis not present

## 2018-10-19 DIAGNOSIS — T887XXA Unspecified adverse effect of drug or medicament, initial encounter: Secondary | ICD-10-CM | POA: Diagnosis not present

## 2018-10-22 DIAGNOSIS — Y999 Unspecified external cause status: Secondary | ICD-10-CM | POA: Diagnosis not present

## 2018-10-22 DIAGNOSIS — T466X5A Adverse effect of antihyperlipidemic and antiarteriosclerotic drugs, initial encounter: Secondary | ICD-10-CM | POA: Diagnosis not present

## 2018-10-22 DIAGNOSIS — K130 Diseases of lips: Secondary | ICD-10-CM | POA: Diagnosis not present

## 2018-10-22 DIAGNOSIS — R21 Rash and other nonspecific skin eruption: Secondary | ICD-10-CM | POA: Diagnosis not present

## 2018-10-22 DIAGNOSIS — X58XXXA Exposure to other specified factors, initial encounter: Secondary | ICD-10-CM | POA: Diagnosis not present

## 2018-10-23 DIAGNOSIS — T887XXD Unspecified adverse effect of drug or medicament, subsequent encounter: Secondary | ICD-10-CM | POA: Diagnosis not present

## 2018-10-26 DIAGNOSIS — T887XXD Unspecified adverse effect of drug or medicament, subsequent encounter: Secondary | ICD-10-CM | POA: Diagnosis not present

## 2018-11-24 DIAGNOSIS — I509 Heart failure, unspecified: Secondary | ICD-10-CM | POA: Diagnosis not present

## 2018-11-24 DIAGNOSIS — I10 Essential (primary) hypertension: Secondary | ICD-10-CM | POA: Diagnosis not present

## 2018-11-24 DIAGNOSIS — E782 Mixed hyperlipidemia: Secondary | ICD-10-CM | POA: Diagnosis not present

## 2018-11-24 DIAGNOSIS — R7309 Other abnormal glucose: Secondary | ICD-10-CM | POA: Diagnosis not present

## 2018-11-30 DIAGNOSIS — R55 Syncope and collapse: Secondary | ICD-10-CM | POA: Diagnosis not present

## 2018-12-04 DIAGNOSIS — I110 Hypertensive heart disease with heart failure: Secondary | ICD-10-CM | POA: Diagnosis not present

## 2018-12-04 DIAGNOSIS — R55 Syncope and collapse: Secondary | ICD-10-CM | POA: Diagnosis not present

## 2018-12-04 DIAGNOSIS — E782 Mixed hyperlipidemia: Secondary | ICD-10-CM | POA: Diagnosis not present

## 2018-12-04 DIAGNOSIS — I10 Essential (primary) hypertension: Secondary | ICD-10-CM | POA: Diagnosis not present

## 2018-12-04 DIAGNOSIS — N390 Urinary tract infection, site not specified: Secondary | ICD-10-CM | POA: Diagnosis not present

## 2018-12-15 DIAGNOSIS — R202 Paresthesia of skin: Secondary | ICD-10-CM | POA: Diagnosis not present

## 2018-12-19 DIAGNOSIS — Z87440 Personal history of urinary (tract) infections: Secondary | ICD-10-CM | POA: Diagnosis not present

## 2018-12-25 DIAGNOSIS — I10 Essential (primary) hypertension: Secondary | ICD-10-CM | POA: Diagnosis not present

## 2019-01-12 DIAGNOSIS — R9431 Abnormal electrocardiogram [ECG] [EKG]: Secondary | ICD-10-CM | POA: Diagnosis not present

## 2019-01-12 DIAGNOSIS — I10 Essential (primary) hypertension: Secondary | ICD-10-CM | POA: Diagnosis not present

## 2019-02-16 DIAGNOSIS — R7309 Other abnormal glucose: Secondary | ICD-10-CM | POA: Diagnosis not present

## 2019-02-16 DIAGNOSIS — I10 Essential (primary) hypertension: Secondary | ICD-10-CM | POA: Diagnosis not present

## 2019-03-08 DIAGNOSIS — I350 Nonrheumatic aortic (valve) stenosis: Secondary | ICD-10-CM | POA: Diagnosis not present

## 2019-03-08 DIAGNOSIS — I119 Hypertensive heart disease without heart failure: Secondary | ICD-10-CM | POA: Diagnosis not present

## 2019-03-08 DIAGNOSIS — I517 Cardiomegaly: Secondary | ICD-10-CM | POA: Diagnosis not present

## 2019-03-30 DIAGNOSIS — Z4689 Encounter for fitting and adjustment of other specified devices: Secondary | ICD-10-CM | POA: Diagnosis not present

## 2019-04-20 DIAGNOSIS — E785 Hyperlipidemia, unspecified: Secondary | ICD-10-CM | POA: Diagnosis not present

## 2019-04-20 DIAGNOSIS — I10 Essential (primary) hypertension: Secondary | ICD-10-CM | POA: Diagnosis not present

## 2019-04-20 DIAGNOSIS — R7309 Other abnormal glucose: Secondary | ICD-10-CM | POA: Diagnosis not present

## 2019-04-27 DIAGNOSIS — Z1231 Encounter for screening mammogram for malignant neoplasm of breast: Secondary | ICD-10-CM | POA: Diagnosis not present

## 2019-05-14 DIAGNOSIS — H52203 Unspecified astigmatism, bilateral: Secondary | ICD-10-CM | POA: Diagnosis not present

## 2019-05-14 DIAGNOSIS — H40023 Open angle with borderline findings, high risk, bilateral: Secondary | ICD-10-CM | POA: Diagnosis not present

## 2019-05-14 DIAGNOSIS — H2512 Age-related nuclear cataract, left eye: Secondary | ICD-10-CM | POA: Diagnosis not present

## 2019-05-14 DIAGNOSIS — H524 Presbyopia: Secondary | ICD-10-CM | POA: Diagnosis not present

## 2019-05-14 DIAGNOSIS — H25012 Cortical age-related cataract, left eye: Secondary | ICD-10-CM | POA: Diagnosis not present

## 2019-06-01 DIAGNOSIS — J029 Acute pharyngitis, unspecified: Secondary | ICD-10-CM | POA: Diagnosis not present

## 2019-06-01 DIAGNOSIS — R05 Cough: Secondary | ICD-10-CM | POA: Diagnosis not present

## 2019-06-01 DIAGNOSIS — Z20822 Contact with and (suspected) exposure to covid-19: Secondary | ICD-10-CM | POA: Diagnosis not present

## 2019-06-21 DIAGNOSIS — E1169 Type 2 diabetes mellitus with other specified complication: Secondary | ICD-10-CM | POA: Diagnosis not present

## 2019-06-21 DIAGNOSIS — Z0000 Encounter for general adult medical examination without abnormal findings: Secondary | ICD-10-CM | POA: Diagnosis not present

## 2019-06-21 DIAGNOSIS — I10 Essential (primary) hypertension: Secondary | ICD-10-CM | POA: Diagnosis not present

## 2019-07-08 DIAGNOSIS — M25511 Pain in right shoulder: Secondary | ICD-10-CM | POA: Diagnosis not present

## 2019-07-08 DIAGNOSIS — S4291XA Fracture of right shoulder girdle, part unspecified, initial encounter for closed fracture: Secondary | ICD-10-CM | POA: Diagnosis not present

## 2019-07-08 DIAGNOSIS — S42141A Displaced fracture of glenoid cavity of scapula, right shoulder, initial encounter for closed fracture: Secondary | ICD-10-CM | POA: Diagnosis not present

## 2019-07-08 DIAGNOSIS — S299XXA Unspecified injury of thorax, initial encounter: Secondary | ICD-10-CM | POA: Diagnosis not present

## 2019-07-10 DIAGNOSIS — S42141A Displaced fracture of glenoid cavity of scapula, right shoulder, initial encounter for closed fracture: Secondary | ICD-10-CM | POA: Diagnosis not present

## 2019-07-10 DIAGNOSIS — M19011 Primary osteoarthritis, right shoulder: Secondary | ICD-10-CM | POA: Diagnosis not present

## 2019-07-24 DIAGNOSIS — K521 Toxic gastroenteritis and colitis: Secondary | ICD-10-CM | POA: Diagnosis not present

## 2019-08-01 DIAGNOSIS — R195 Other fecal abnormalities: Secondary | ICD-10-CM | POA: Diagnosis not present

## 2019-08-01 DIAGNOSIS — S42141A Displaced fracture of glenoid cavity of scapula, right shoulder, initial encounter for closed fracture: Secondary | ICD-10-CM | POA: Diagnosis not present

## 2019-08-01 DIAGNOSIS — T50905A Adverse effect of unspecified drugs, medicaments and biological substances, initial encounter: Secondary | ICD-10-CM | POA: Diagnosis not present

## 2019-08-02 DIAGNOSIS — R197 Diarrhea, unspecified: Secondary | ICD-10-CM | POA: Diagnosis not present

## 2019-08-02 DIAGNOSIS — I10 Essential (primary) hypertension: Secondary | ICD-10-CM | POA: Diagnosis not present

## 2019-08-06 DIAGNOSIS — S42141A Displaced fracture of glenoid cavity of scapula, right shoulder, initial encounter for closed fracture: Secondary | ICD-10-CM | POA: Diagnosis not present

## 2019-08-06 DIAGNOSIS — S42151A Displaced fracture of neck of scapula, right shoulder, initial encounter for closed fracture: Secondary | ICD-10-CM | POA: Diagnosis not present

## 2019-08-06 DIAGNOSIS — M25611 Stiffness of right shoulder, not elsewhere classified: Secondary | ICD-10-CM | POA: Diagnosis not present

## 2019-08-17 DIAGNOSIS — M25611 Stiffness of right shoulder, not elsewhere classified: Secondary | ICD-10-CM | POA: Diagnosis not present

## 2019-08-17 DIAGNOSIS — S42141A Displaced fracture of glenoid cavity of scapula, right shoulder, initial encounter for closed fracture: Secondary | ICD-10-CM | POA: Diagnosis not present

## 2019-08-17 DIAGNOSIS — S42151A Displaced fracture of neck of scapula, right shoulder, initial encounter for closed fracture: Secondary | ICD-10-CM | POA: Diagnosis not present

## 2019-08-21 DIAGNOSIS — M25611 Stiffness of right shoulder, not elsewhere classified: Secondary | ICD-10-CM | POA: Diagnosis not present

## 2019-08-21 DIAGNOSIS — S42151A Displaced fracture of neck of scapula, right shoulder, initial encounter for closed fracture: Secondary | ICD-10-CM | POA: Diagnosis not present

## 2019-08-21 DIAGNOSIS — S42141A Displaced fracture of glenoid cavity of scapula, right shoulder, initial encounter for closed fracture: Secondary | ICD-10-CM | POA: Diagnosis not present

## 2019-08-23 DIAGNOSIS — M25611 Stiffness of right shoulder, not elsewhere classified: Secondary | ICD-10-CM | POA: Diagnosis not present

## 2019-08-23 DIAGNOSIS — S42141A Displaced fracture of glenoid cavity of scapula, right shoulder, initial encounter for closed fracture: Secondary | ICD-10-CM | POA: Diagnosis not present

## 2019-08-23 DIAGNOSIS — S42151A Displaced fracture of neck of scapula, right shoulder, initial encounter for closed fracture: Secondary | ICD-10-CM | POA: Diagnosis not present

## 2019-08-24 DIAGNOSIS — E785 Hyperlipidemia, unspecified: Secondary | ICD-10-CM | POA: Diagnosis not present

## 2019-08-24 DIAGNOSIS — E1169 Type 2 diabetes mellitus with other specified complication: Secondary | ICD-10-CM | POA: Diagnosis not present

## 2019-08-24 DIAGNOSIS — I10 Essential (primary) hypertension: Secondary | ICD-10-CM | POA: Diagnosis not present

## 2019-08-28 DIAGNOSIS — M25611 Stiffness of right shoulder, not elsewhere classified: Secondary | ICD-10-CM | POA: Diagnosis not present

## 2019-08-28 DIAGNOSIS — S42141A Displaced fracture of glenoid cavity of scapula, right shoulder, initial encounter for closed fracture: Secondary | ICD-10-CM | POA: Diagnosis not present

## 2019-08-28 DIAGNOSIS — S42151A Displaced fracture of neck of scapula, right shoulder, initial encounter for closed fracture: Secondary | ICD-10-CM | POA: Diagnosis not present

## 2019-08-29 DIAGNOSIS — S42151D Displaced fracture of neck of scapula, right shoulder, subsequent encounter for fracture with routine healing: Secondary | ICD-10-CM | POA: Diagnosis not present

## 2019-08-29 DIAGNOSIS — S42141D Displaced fracture of glenoid cavity of scapula, right shoulder, subsequent encounter for fracture with routine healing: Secondary | ICD-10-CM | POA: Diagnosis not present

## 2019-08-30 DIAGNOSIS — S42151A Displaced fracture of neck of scapula, right shoulder, initial encounter for closed fracture: Secondary | ICD-10-CM | POA: Diagnosis not present

## 2019-08-30 DIAGNOSIS — S42141A Displaced fracture of glenoid cavity of scapula, right shoulder, initial encounter for closed fracture: Secondary | ICD-10-CM | POA: Diagnosis not present

## 2019-08-30 DIAGNOSIS — M25611 Stiffness of right shoulder, not elsewhere classified: Secondary | ICD-10-CM | POA: Diagnosis not present

## 2019-09-03 DIAGNOSIS — M25611 Stiffness of right shoulder, not elsewhere classified: Secondary | ICD-10-CM | POA: Diagnosis not present

## 2019-09-03 DIAGNOSIS — S42151A Displaced fracture of neck of scapula, right shoulder, initial encounter for closed fracture: Secondary | ICD-10-CM | POA: Diagnosis not present

## 2019-09-03 DIAGNOSIS — S42141A Displaced fracture of glenoid cavity of scapula, right shoulder, initial encounter for closed fracture: Secondary | ICD-10-CM | POA: Diagnosis not present

## 2019-09-05 DIAGNOSIS — S42151A Displaced fracture of neck of scapula, right shoulder, initial encounter for closed fracture: Secondary | ICD-10-CM | POA: Diagnosis not present

## 2019-09-05 DIAGNOSIS — S42141A Displaced fracture of glenoid cavity of scapula, right shoulder, initial encounter for closed fracture: Secondary | ICD-10-CM | POA: Diagnosis not present

## 2019-09-05 DIAGNOSIS — M25611 Stiffness of right shoulder, not elsewhere classified: Secondary | ICD-10-CM | POA: Diagnosis not present

## 2019-09-12 DIAGNOSIS — S42141A Displaced fracture of glenoid cavity of scapula, right shoulder, initial encounter for closed fracture: Secondary | ICD-10-CM | POA: Diagnosis not present

## 2019-09-12 DIAGNOSIS — M25611 Stiffness of right shoulder, not elsewhere classified: Secondary | ICD-10-CM | POA: Diagnosis not present

## 2019-09-12 DIAGNOSIS — S42151A Displaced fracture of neck of scapula, right shoulder, initial encounter for closed fracture: Secondary | ICD-10-CM | POA: Diagnosis not present

## 2019-09-19 DIAGNOSIS — S42151A Displaced fracture of neck of scapula, right shoulder, initial encounter for closed fracture: Secondary | ICD-10-CM | POA: Diagnosis not present

## 2019-09-19 DIAGNOSIS — M25611 Stiffness of right shoulder, not elsewhere classified: Secondary | ICD-10-CM | POA: Diagnosis not present

## 2019-09-19 DIAGNOSIS — S42141A Displaced fracture of glenoid cavity of scapula, right shoulder, initial encounter for closed fracture: Secondary | ICD-10-CM | POA: Diagnosis not present

## 2019-09-21 DIAGNOSIS — M25611 Stiffness of right shoulder, not elsewhere classified: Secondary | ICD-10-CM | POA: Diagnosis not present

## 2019-09-21 DIAGNOSIS — S42141A Displaced fracture of glenoid cavity of scapula, right shoulder, initial encounter for closed fracture: Secondary | ICD-10-CM | POA: Diagnosis not present

## 2019-09-21 DIAGNOSIS — S42151A Displaced fracture of neck of scapula, right shoulder, initial encounter for closed fracture: Secondary | ICD-10-CM | POA: Diagnosis not present

## 2019-09-25 DIAGNOSIS — S42151A Displaced fracture of neck of scapula, right shoulder, initial encounter for closed fracture: Secondary | ICD-10-CM | POA: Diagnosis not present

## 2019-09-25 DIAGNOSIS — M25611 Stiffness of right shoulder, not elsewhere classified: Secondary | ICD-10-CM | POA: Diagnosis not present

## 2019-09-25 DIAGNOSIS — S42141A Displaced fracture of glenoid cavity of scapula, right shoulder, initial encounter for closed fracture: Secondary | ICD-10-CM | POA: Diagnosis not present

## 2019-09-27 DIAGNOSIS — S42141A Displaced fracture of glenoid cavity of scapula, right shoulder, initial encounter for closed fracture: Secondary | ICD-10-CM | POA: Diagnosis not present

## 2019-09-27 DIAGNOSIS — M25611 Stiffness of right shoulder, not elsewhere classified: Secondary | ICD-10-CM | POA: Diagnosis not present

## 2019-09-27 DIAGNOSIS — S42151A Displaced fracture of neck of scapula, right shoulder, initial encounter for closed fracture: Secondary | ICD-10-CM | POA: Diagnosis not present

## 2019-10-02 DIAGNOSIS — M25611 Stiffness of right shoulder, not elsewhere classified: Secondary | ICD-10-CM | POA: Diagnosis not present

## 2019-10-02 DIAGNOSIS — S42151A Displaced fracture of neck of scapula, right shoulder, initial encounter for closed fracture: Secondary | ICD-10-CM | POA: Diagnosis not present

## 2019-10-02 DIAGNOSIS — S42141A Displaced fracture of glenoid cavity of scapula, right shoulder, initial encounter for closed fracture: Secondary | ICD-10-CM | POA: Diagnosis not present

## 2019-10-04 DIAGNOSIS — S42151A Displaced fracture of neck of scapula, right shoulder, initial encounter for closed fracture: Secondary | ICD-10-CM | POA: Diagnosis not present

## 2019-10-04 DIAGNOSIS — S42141A Displaced fracture of glenoid cavity of scapula, right shoulder, initial encounter for closed fracture: Secondary | ICD-10-CM | POA: Diagnosis not present

## 2019-10-04 DIAGNOSIS — M25611 Stiffness of right shoulder, not elsewhere classified: Secondary | ICD-10-CM | POA: Diagnosis not present

## 2019-10-25 DIAGNOSIS — E1169 Type 2 diabetes mellitus with other specified complication: Secondary | ICD-10-CM | POA: Diagnosis not present

## 2019-10-25 DIAGNOSIS — E785 Hyperlipidemia, unspecified: Secondary | ICD-10-CM | POA: Diagnosis not present

## 2019-10-25 DIAGNOSIS — I10 Essential (primary) hypertension: Secondary | ICD-10-CM | POA: Diagnosis not present

## 2019-11-12 DIAGNOSIS — B029 Zoster without complications: Secondary | ICD-10-CM | POA: Diagnosis not present

## 2019-11-29 DIAGNOSIS — Z4689 Encounter for fitting and adjustment of other specified devices: Secondary | ICD-10-CM | POA: Diagnosis not present

## 2019-12-25 DIAGNOSIS — E785 Hyperlipidemia, unspecified: Secondary | ICD-10-CM | POA: Diagnosis not present

## 2019-12-25 DIAGNOSIS — E1169 Type 2 diabetes mellitus with other specified complication: Secondary | ICD-10-CM | POA: Diagnosis not present

## 2019-12-25 DIAGNOSIS — I10 Essential (primary) hypertension: Secondary | ICD-10-CM | POA: Diagnosis not present

## 2019-12-28 DIAGNOSIS — I10 Essential (primary) hypertension: Secondary | ICD-10-CM | POA: Diagnosis not present

## 2019-12-28 DIAGNOSIS — B029 Zoster without complications: Secondary | ICD-10-CM | POA: Diagnosis not present

## 2019-12-28 DIAGNOSIS — E1169 Type 2 diabetes mellitus with other specified complication: Secondary | ICD-10-CM | POA: Diagnosis not present

## 2019-12-28 DIAGNOSIS — E785 Hyperlipidemia, unspecified: Secondary | ICD-10-CM | POA: Diagnosis not present

## 2020-01-21 DIAGNOSIS — M79661 Pain in right lower leg: Secondary | ICD-10-CM | POA: Diagnosis not present

## 2020-01-21 DIAGNOSIS — H40023 Open angle with borderline findings, high risk, bilateral: Secondary | ICD-10-CM | POA: Diagnosis not present

## 2020-01-21 DIAGNOSIS — H25012 Cortical age-related cataract, left eye: Secondary | ICD-10-CM | POA: Diagnosis not present

## 2020-01-21 DIAGNOSIS — H52203 Unspecified astigmatism, bilateral: Secondary | ICD-10-CM | POA: Diagnosis not present

## 2020-01-21 DIAGNOSIS — H524 Presbyopia: Secondary | ICD-10-CM | POA: Diagnosis not present

## 2020-01-21 DIAGNOSIS — H2512 Age-related nuclear cataract, left eye: Secondary | ICD-10-CM | POA: Diagnosis not present

## 2020-01-25 DIAGNOSIS — E1169 Type 2 diabetes mellitus with other specified complication: Secondary | ICD-10-CM | POA: Diagnosis not present

## 2020-01-25 DIAGNOSIS — I10 Essential (primary) hypertension: Secondary | ICD-10-CM | POA: Diagnosis not present

## 2020-01-25 DIAGNOSIS — E785 Hyperlipidemia, unspecified: Secondary | ICD-10-CM | POA: Diagnosis not present

## 2020-02-23 DIAGNOSIS — I10 Essential (primary) hypertension: Secondary | ICD-10-CM | POA: Diagnosis not present

## 2020-02-23 DIAGNOSIS — E1169 Type 2 diabetes mellitus with other specified complication: Secondary | ICD-10-CM | POA: Diagnosis not present

## 2020-02-23 DIAGNOSIS — E785 Hyperlipidemia, unspecified: Secondary | ICD-10-CM | POA: Diagnosis not present

## 2020-03-12 DIAGNOSIS — I10 Essential (primary) hypertension: Secondary | ICD-10-CM | POA: Diagnosis not present

## 2020-03-12 DIAGNOSIS — I350 Nonrheumatic aortic (valve) stenosis: Secondary | ICD-10-CM | POA: Diagnosis not present

## 2020-03-12 DIAGNOSIS — R9431 Abnormal electrocardiogram [ECG] [EKG]: Secondary | ICD-10-CM | POA: Diagnosis not present

## 2020-03-24 DIAGNOSIS — E1169 Type 2 diabetes mellitus with other specified complication: Secondary | ICD-10-CM | POA: Diagnosis not present

## 2020-03-24 DIAGNOSIS — I10 Essential (primary) hypertension: Secondary | ICD-10-CM | POA: Diagnosis not present

## 2020-03-24 DIAGNOSIS — E785 Hyperlipidemia, unspecified: Secondary | ICD-10-CM | POA: Diagnosis not present

## 2020-03-27 DIAGNOSIS — Z4689 Encounter for fitting and adjustment of other specified devices: Secondary | ICD-10-CM | POA: Diagnosis not present

## 2020-04-30 DIAGNOSIS — E785 Hyperlipidemia, unspecified: Secondary | ICD-10-CM | POA: Diagnosis not present

## 2020-04-30 DIAGNOSIS — N3281 Overactive bladder: Secondary | ICD-10-CM | POA: Diagnosis not present

## 2020-04-30 DIAGNOSIS — Z1231 Encounter for screening mammogram for malignant neoplasm of breast: Secondary | ICD-10-CM | POA: Diagnosis not present

## 2020-04-30 DIAGNOSIS — R7303 Prediabetes: Secondary | ICD-10-CM | POA: Diagnosis not present

## 2020-04-30 DIAGNOSIS — E782 Mixed hyperlipidemia: Secondary | ICD-10-CM | POA: Diagnosis not present

## 2020-04-30 DIAGNOSIS — R202 Paresthesia of skin: Secondary | ICD-10-CM | POA: Diagnosis not present

## 2020-04-30 DIAGNOSIS — I10 Essential (primary) hypertension: Secondary | ICD-10-CM | POA: Diagnosis not present

## 2020-04-30 DIAGNOSIS — E1169 Type 2 diabetes mellitus with other specified complication: Secondary | ICD-10-CM | POA: Diagnosis not present

## 2020-05-24 DIAGNOSIS — E1169 Type 2 diabetes mellitus with other specified complication: Secondary | ICD-10-CM | POA: Diagnosis not present

## 2020-05-24 DIAGNOSIS — I10 Essential (primary) hypertension: Secondary | ICD-10-CM | POA: Diagnosis not present

## 2020-05-24 DIAGNOSIS — E785 Hyperlipidemia, unspecified: Secondary | ICD-10-CM | POA: Diagnosis not present

## 2020-05-30 DIAGNOSIS — I10 Essential (primary) hypertension: Secondary | ICD-10-CM | POA: Diagnosis not present

## 2020-05-30 DIAGNOSIS — E1169 Type 2 diabetes mellitus with other specified complication: Secondary | ICD-10-CM | POA: Diagnosis not present

## 2020-05-30 DIAGNOSIS — E782 Mixed hyperlipidemia: Secondary | ICD-10-CM | POA: Diagnosis not present

## 2020-05-30 DIAGNOSIS — N3281 Overactive bladder: Secondary | ICD-10-CM | POA: Diagnosis not present

## 2020-06-16 DIAGNOSIS — T63481A Toxic effect of venom of other arthropod, accidental (unintentional), initial encounter: Secondary | ICD-10-CM | POA: Diagnosis not present

## 2020-06-24 DIAGNOSIS — B351 Tinea unguium: Secondary | ICD-10-CM | POA: Diagnosis not present

## 2020-06-24 DIAGNOSIS — E785 Hyperlipidemia, unspecified: Secondary | ICD-10-CM | POA: Diagnosis not present

## 2020-06-24 DIAGNOSIS — E1169 Type 2 diabetes mellitus with other specified complication: Secondary | ICD-10-CM | POA: Diagnosis not present

## 2020-06-24 DIAGNOSIS — I10 Essential (primary) hypertension: Secondary | ICD-10-CM | POA: Diagnosis not present

## 2020-06-24 DIAGNOSIS — L03032 Cellulitis of left toe: Secondary | ICD-10-CM | POA: Diagnosis not present

## 2020-06-26 DIAGNOSIS — Z4689 Encounter for fitting and adjustment of other specified devices: Secondary | ICD-10-CM | POA: Diagnosis not present

## 2020-06-27 DIAGNOSIS — I350 Nonrheumatic aortic (valve) stenosis: Secondary | ICD-10-CM | POA: Diagnosis not present

## 2020-07-24 DIAGNOSIS — E785 Hyperlipidemia, unspecified: Secondary | ICD-10-CM | POA: Diagnosis not present

## 2020-07-24 DIAGNOSIS — E1169 Type 2 diabetes mellitus with other specified complication: Secondary | ICD-10-CM | POA: Diagnosis not present

## 2020-07-24 DIAGNOSIS — I10 Essential (primary) hypertension: Secondary | ICD-10-CM | POA: Diagnosis not present

## 2020-09-19 DIAGNOSIS — H52203 Unspecified astigmatism, bilateral: Secondary | ICD-10-CM | POA: Diagnosis not present

## 2020-09-19 DIAGNOSIS — H268 Other specified cataract: Secondary | ICD-10-CM | POA: Diagnosis not present

## 2020-09-19 DIAGNOSIS — Z83511 Family history of glaucoma: Secondary | ICD-10-CM | POA: Diagnosis not present

## 2020-09-19 DIAGNOSIS — H25042 Posterior subcapsular polar age-related cataract, left eye: Secondary | ICD-10-CM | POA: Diagnosis not present

## 2020-09-19 DIAGNOSIS — H5203 Hypermetropia, bilateral: Secondary | ICD-10-CM | POA: Diagnosis not present

## 2020-09-19 DIAGNOSIS — H2512 Age-related nuclear cataract, left eye: Secondary | ICD-10-CM | POA: Diagnosis not present

## 2020-09-19 DIAGNOSIS — H43393 Other vitreous opacities, bilateral: Secondary | ICD-10-CM | POA: Diagnosis not present

## 2020-09-19 DIAGNOSIS — H40023 Open angle with borderline findings, high risk, bilateral: Secondary | ICD-10-CM | POA: Diagnosis not present

## 2020-09-19 DIAGNOSIS — Z961 Presence of intraocular lens: Secondary | ICD-10-CM | POA: Diagnosis not present

## 2020-09-19 DIAGNOSIS — H524 Presbyopia: Secondary | ICD-10-CM | POA: Diagnosis not present

## 2020-09-24 DIAGNOSIS — E785 Hyperlipidemia, unspecified: Secondary | ICD-10-CM | POA: Diagnosis not present

## 2020-09-24 DIAGNOSIS — E1169 Type 2 diabetes mellitus with other specified complication: Secondary | ICD-10-CM | POA: Diagnosis not present

## 2020-09-24 DIAGNOSIS — I10 Essential (primary) hypertension: Secondary | ICD-10-CM | POA: Diagnosis not present

## 2020-09-30 DIAGNOSIS — E782 Mixed hyperlipidemia: Secondary | ICD-10-CM | POA: Diagnosis not present

## 2020-09-30 DIAGNOSIS — I10 Essential (primary) hypertension: Secondary | ICD-10-CM | POA: Diagnosis not present

## 2020-09-30 DIAGNOSIS — Z0001 Encounter for general adult medical examination with abnormal findings: Secondary | ICD-10-CM | POA: Diagnosis not present

## 2020-09-30 DIAGNOSIS — R7309 Other abnormal glucose: Secondary | ICD-10-CM | POA: Diagnosis not present

## 2020-10-02 DIAGNOSIS — Z4689 Encounter for fitting and adjustment of other specified devices: Secondary | ICD-10-CM | POA: Diagnosis not present

## 2020-10-24 DIAGNOSIS — E785 Hyperlipidemia, unspecified: Secondary | ICD-10-CM | POA: Diagnosis not present

## 2020-10-24 DIAGNOSIS — E1169 Type 2 diabetes mellitus with other specified complication: Secondary | ICD-10-CM | POA: Diagnosis not present

## 2020-10-24 DIAGNOSIS — I10 Essential (primary) hypertension: Secondary | ICD-10-CM | POA: Diagnosis not present

## 2021-01-12 DIAGNOSIS — N3281 Overactive bladder: Secondary | ICD-10-CM | POA: Diagnosis not present

## 2021-01-12 DIAGNOSIS — R350 Frequency of micturition: Secondary | ICD-10-CM | POA: Diagnosis not present

## 2021-02-03 DIAGNOSIS — Z4689 Encounter for fitting and adjustment of other specified devices: Secondary | ICD-10-CM | POA: Diagnosis not present

## 2021-02-19 DIAGNOSIS — N3281 Overactive bladder: Secondary | ICD-10-CM | POA: Diagnosis not present

## 2021-03-02 DIAGNOSIS — R7309 Other abnormal glucose: Secondary | ICD-10-CM | POA: Diagnosis not present

## 2021-03-02 DIAGNOSIS — E782 Mixed hyperlipidemia: Secondary | ICD-10-CM | POA: Diagnosis not present

## 2021-03-02 DIAGNOSIS — N3281 Overactive bladder: Secondary | ICD-10-CM | POA: Diagnosis not present

## 2021-03-02 DIAGNOSIS — E1169 Type 2 diabetes mellitus with other specified complication: Secondary | ICD-10-CM | POA: Diagnosis not present

## 2021-03-02 DIAGNOSIS — I10 Essential (primary) hypertension: Secondary | ICD-10-CM | POA: Diagnosis not present

## 2021-03-17 DIAGNOSIS — E7849 Other hyperlipidemia: Secondary | ICD-10-CM | POA: Diagnosis not present

## 2021-03-17 DIAGNOSIS — I350 Nonrheumatic aortic (valve) stenosis: Secondary | ICD-10-CM | POA: Diagnosis not present

## 2021-03-17 DIAGNOSIS — I10 Essential (primary) hypertension: Secondary | ICD-10-CM | POA: Diagnosis not present

## 2021-04-13 DIAGNOSIS — H25042 Posterior subcapsular polar age-related cataract, left eye: Secondary | ICD-10-CM | POA: Diagnosis not present

## 2021-04-13 DIAGNOSIS — Z83511 Family history of glaucoma: Secondary | ICD-10-CM | POA: Diagnosis not present

## 2021-04-13 DIAGNOSIS — H40023 Open angle with borderline findings, high risk, bilateral: Secondary | ICD-10-CM | POA: Diagnosis not present

## 2021-04-13 DIAGNOSIS — H2512 Age-related nuclear cataract, left eye: Secondary | ICD-10-CM | POA: Diagnosis not present

## 2021-04-13 DIAGNOSIS — H524 Presbyopia: Secondary | ICD-10-CM | POA: Diagnosis not present

## 2021-04-13 DIAGNOSIS — H52203 Unspecified astigmatism, bilateral: Secondary | ICD-10-CM | POA: Diagnosis not present

## 2021-04-13 DIAGNOSIS — H5203 Hypermetropia, bilateral: Secondary | ICD-10-CM | POA: Diagnosis not present

## 2021-04-13 DIAGNOSIS — H268 Other specified cataract: Secondary | ICD-10-CM | POA: Diagnosis not present

## 2021-04-13 DIAGNOSIS — H43393 Other vitreous opacities, bilateral: Secondary | ICD-10-CM | POA: Diagnosis not present

## 2021-04-13 DIAGNOSIS — Z961 Presence of intraocular lens: Secondary | ICD-10-CM | POA: Diagnosis not present

## 2021-05-07 DIAGNOSIS — Y998 Other external cause status: Secondary | ICD-10-CM | POA: Diagnosis not present

## 2021-05-07 DIAGNOSIS — R000 Tachycardia, unspecified: Secondary | ICD-10-CM | POA: Diagnosis not present

## 2021-05-07 DIAGNOSIS — M542 Cervicalgia: Secondary | ICD-10-CM | POA: Diagnosis not present

## 2021-05-07 DIAGNOSIS — S0990XA Unspecified injury of head, initial encounter: Secondary | ICD-10-CM | POA: Diagnosis not present

## 2021-05-07 DIAGNOSIS — Z041 Encounter for examination and observation following transport accident: Secondary | ICD-10-CM | POA: Diagnosis not present

## 2021-05-07 DIAGNOSIS — M79661 Pain in right lower leg: Secondary | ICD-10-CM | POA: Diagnosis not present

## 2021-05-07 DIAGNOSIS — Y92410 Unspecified street and highway as the place of occurrence of the external cause: Secondary | ICD-10-CM | POA: Diagnosis not present

## 2021-05-07 DIAGNOSIS — S39012A Strain of muscle, fascia and tendon of lower back, initial encounter: Secondary | ICD-10-CM | POA: Diagnosis not present

## 2021-05-11 DIAGNOSIS — M549 Dorsalgia, unspecified: Secondary | ICD-10-CM | POA: Diagnosis not present

## 2021-05-11 DIAGNOSIS — M79604 Pain in right leg: Secondary | ICD-10-CM | POA: Diagnosis not present

## 2021-06-16 DIAGNOSIS — M25511 Pain in right shoulder: Secondary | ICD-10-CM | POA: Diagnosis not present

## 2021-06-17 DIAGNOSIS — E1169 Type 2 diabetes mellitus with other specified complication: Secondary | ICD-10-CM | POA: Diagnosis not present

## 2021-06-17 DIAGNOSIS — N3281 Overactive bladder: Secondary | ICD-10-CM | POA: Diagnosis not present

## 2021-06-17 DIAGNOSIS — R430 Anosmia: Secondary | ICD-10-CM | POA: Diagnosis not present

## 2021-06-17 DIAGNOSIS — I10 Essential (primary) hypertension: Secondary | ICD-10-CM | POA: Diagnosis not present

## 2021-06-17 DIAGNOSIS — R202 Paresthesia of skin: Secondary | ICD-10-CM | POA: Diagnosis not present

## 2021-06-17 DIAGNOSIS — I110 Hypertensive heart disease with heart failure: Secondary | ICD-10-CM | POA: Diagnosis not present

## 2021-06-17 DIAGNOSIS — E785 Hyperlipidemia, unspecified: Secondary | ICD-10-CM | POA: Diagnosis not present

## 2021-06-25 DIAGNOSIS — N8112 Cystocele, lateral: Secondary | ICD-10-CM | POA: Diagnosis not present

## 2021-06-25 DIAGNOSIS — N3281 Overactive bladder: Secondary | ICD-10-CM | POA: Diagnosis not present

## 2021-06-25 DIAGNOSIS — N3592 Unspecified urethral stricture, female: Secondary | ICD-10-CM | POA: Diagnosis not present

## 2021-06-30 DIAGNOSIS — H2589 Other age-related cataract: Secondary | ICD-10-CM | POA: Diagnosis not present

## 2021-06-30 DIAGNOSIS — H35371 Puckering of macula, right eye: Secondary | ICD-10-CM | POA: Diagnosis not present

## 2021-06-30 DIAGNOSIS — Z961 Presence of intraocular lens: Secondary | ICD-10-CM | POA: Diagnosis not present

## 2021-07-03 DIAGNOSIS — M4316 Spondylolisthesis, lumbar region: Secondary | ICD-10-CM | POA: Diagnosis not present

## 2021-07-03 DIAGNOSIS — M546 Pain in thoracic spine: Secondary | ICD-10-CM | POA: Diagnosis not present

## 2021-07-06 DIAGNOSIS — H547 Unspecified visual loss: Secondary | ICD-10-CM | POA: Diagnosis not present

## 2021-07-08 DIAGNOSIS — M6281 Muscle weakness (generalized): Secondary | ICD-10-CM | POA: Diagnosis not present

## 2021-07-08 DIAGNOSIS — M7918 Myalgia, other site: Secondary | ICD-10-CM | POA: Diagnosis not present

## 2021-07-08 DIAGNOSIS — R293 Abnormal posture: Secondary | ICD-10-CM | POA: Diagnosis not present

## 2021-07-08 DIAGNOSIS — M25511 Pain in right shoulder: Secondary | ICD-10-CM | POA: Diagnosis not present

## 2021-07-08 DIAGNOSIS — M2560 Stiffness of unspecified joint, not elsewhere classified: Secondary | ICD-10-CM | POA: Diagnosis not present

## 2021-07-21 DIAGNOSIS — M2560 Stiffness of unspecified joint, not elsewhere classified: Secondary | ICD-10-CM | POA: Diagnosis not present

## 2021-07-21 DIAGNOSIS — M25511 Pain in right shoulder: Secondary | ICD-10-CM | POA: Diagnosis not present

## 2021-07-21 DIAGNOSIS — M6281 Muscle weakness (generalized): Secondary | ICD-10-CM | POA: Diagnosis not present

## 2021-07-21 DIAGNOSIS — M7918 Myalgia, other site: Secondary | ICD-10-CM | POA: Diagnosis not present

## 2021-07-21 DIAGNOSIS — R293 Abnormal posture: Secondary | ICD-10-CM | POA: Diagnosis not present

## 2021-07-24 DIAGNOSIS — I10 Essential (primary) hypertension: Secondary | ICD-10-CM | POA: Diagnosis not present

## 2021-07-24 DIAGNOSIS — E1169 Type 2 diabetes mellitus with other specified complication: Secondary | ICD-10-CM | POA: Diagnosis not present

## 2021-07-30 DIAGNOSIS — Z961 Presence of intraocular lens: Secondary | ICD-10-CM | POA: Diagnosis not present

## 2021-07-30 DIAGNOSIS — H2589 Other age-related cataract: Secondary | ICD-10-CM | POA: Diagnosis not present

## 2021-08-04 DIAGNOSIS — M7918 Myalgia, other site: Secondary | ICD-10-CM | POA: Diagnosis not present

## 2021-08-04 DIAGNOSIS — M2560 Stiffness of unspecified joint, not elsewhere classified: Secondary | ICD-10-CM | POA: Diagnosis not present

## 2021-08-04 DIAGNOSIS — H547 Unspecified visual loss: Secondary | ICD-10-CM | POA: Diagnosis not present

## 2021-08-04 DIAGNOSIS — I10 Essential (primary) hypertension: Secondary | ICD-10-CM | POA: Diagnosis not present

## 2021-08-04 DIAGNOSIS — M6281 Muscle weakness (generalized): Secondary | ICD-10-CM | POA: Diagnosis not present

## 2021-08-04 DIAGNOSIS — R350 Frequency of micturition: Secondary | ICD-10-CM | POA: Diagnosis not present

## 2021-08-04 DIAGNOSIS — M25511 Pain in right shoulder: Secondary | ICD-10-CM | POA: Diagnosis not present

## 2021-08-04 DIAGNOSIS — N3281 Overactive bladder: Secondary | ICD-10-CM | POA: Diagnosis not present

## 2021-08-04 DIAGNOSIS — E1169 Type 2 diabetes mellitus with other specified complication: Secondary | ICD-10-CM | POA: Diagnosis not present

## 2021-08-04 DIAGNOSIS — R293 Abnormal posture: Secondary | ICD-10-CM | POA: Diagnosis not present

## 2021-08-06 DIAGNOSIS — M25511 Pain in right shoulder: Secondary | ICD-10-CM | POA: Diagnosis not present

## 2021-08-06 DIAGNOSIS — R293 Abnormal posture: Secondary | ICD-10-CM | POA: Diagnosis not present

## 2021-08-06 DIAGNOSIS — M2560 Stiffness of unspecified joint, not elsewhere classified: Secondary | ICD-10-CM | POA: Diagnosis not present

## 2021-08-06 DIAGNOSIS — M6281 Muscle weakness (generalized): Secondary | ICD-10-CM | POA: Diagnosis not present

## 2021-08-06 DIAGNOSIS — M7918 Myalgia, other site: Secondary | ICD-10-CM | POA: Diagnosis not present

## 2021-08-07 DIAGNOSIS — H3540 Unspecified peripheral retinal degeneration: Secondary | ICD-10-CM | POA: Diagnosis not present

## 2021-08-07 DIAGNOSIS — H43393 Other vitreous opacities, bilateral: Secondary | ICD-10-CM | POA: Diagnosis not present

## 2021-08-07 DIAGNOSIS — H43392 Other vitreous opacities, left eye: Secondary | ICD-10-CM | POA: Diagnosis not present

## 2021-08-07 DIAGNOSIS — H43811 Vitreous degeneration, right eye: Secondary | ICD-10-CM | POA: Diagnosis not present

## 2021-08-07 DIAGNOSIS — H35361 Drusen (degenerative) of macula, right eye: Secondary | ICD-10-CM | POA: Diagnosis not present

## 2021-08-11 DIAGNOSIS — M7918 Myalgia, other site: Secondary | ICD-10-CM | POA: Diagnosis not present

## 2021-08-11 DIAGNOSIS — M2560 Stiffness of unspecified joint, not elsewhere classified: Secondary | ICD-10-CM | POA: Diagnosis not present

## 2021-08-11 DIAGNOSIS — R293 Abnormal posture: Secondary | ICD-10-CM | POA: Diagnosis not present

## 2021-08-11 DIAGNOSIS — M6281 Muscle weakness (generalized): Secondary | ICD-10-CM | POA: Diagnosis not present

## 2021-08-11 DIAGNOSIS — M25511 Pain in right shoulder: Secondary | ICD-10-CM | POA: Diagnosis not present

## 2021-08-14 DIAGNOSIS — M6281 Muscle weakness (generalized): Secondary | ICD-10-CM | POA: Diagnosis not present

## 2021-08-14 DIAGNOSIS — M2560 Stiffness of unspecified joint, not elsewhere classified: Secondary | ICD-10-CM | POA: Diagnosis not present

## 2021-08-14 DIAGNOSIS — M25511 Pain in right shoulder: Secondary | ICD-10-CM | POA: Diagnosis not present

## 2021-08-14 DIAGNOSIS — M7918 Myalgia, other site: Secondary | ICD-10-CM | POA: Diagnosis not present

## 2021-08-14 DIAGNOSIS — R293 Abnormal posture: Secondary | ICD-10-CM | POA: Diagnosis not present

## 2021-08-18 DIAGNOSIS — M25511 Pain in right shoulder: Secondary | ICD-10-CM | POA: Diagnosis not present

## 2021-08-19 DIAGNOSIS — R293 Abnormal posture: Secondary | ICD-10-CM | POA: Diagnosis not present

## 2021-08-19 DIAGNOSIS — M25511 Pain in right shoulder: Secondary | ICD-10-CM | POA: Diagnosis not present

## 2021-08-19 DIAGNOSIS — M6281 Muscle weakness (generalized): Secondary | ICD-10-CM | POA: Diagnosis not present

## 2021-08-19 DIAGNOSIS — M2560 Stiffness of unspecified joint, not elsewhere classified: Secondary | ICD-10-CM | POA: Diagnosis not present

## 2021-08-19 DIAGNOSIS — M7918 Myalgia, other site: Secondary | ICD-10-CM | POA: Diagnosis not present

## 2021-08-21 DIAGNOSIS — M7918 Myalgia, other site: Secondary | ICD-10-CM | POA: Diagnosis not present

## 2021-08-21 DIAGNOSIS — M6281 Muscle weakness (generalized): Secondary | ICD-10-CM | POA: Diagnosis not present

## 2021-08-21 DIAGNOSIS — M25511 Pain in right shoulder: Secondary | ICD-10-CM | POA: Diagnosis not present

## 2021-08-21 DIAGNOSIS — M2560 Stiffness of unspecified joint, not elsewhere classified: Secondary | ICD-10-CM | POA: Diagnosis not present

## 2021-08-21 DIAGNOSIS — R293 Abnormal posture: Secondary | ICD-10-CM | POA: Diagnosis not present

## 2021-08-24 DIAGNOSIS — Z961 Presence of intraocular lens: Secondary | ICD-10-CM | POA: Diagnosis not present

## 2021-08-24 DIAGNOSIS — H2589 Other age-related cataract: Secondary | ICD-10-CM | POA: Diagnosis not present

## 2021-08-24 DIAGNOSIS — Z8673 Personal history of transient ischemic attack (TIA), and cerebral infarction without residual deficits: Secondary | ICD-10-CM | POA: Diagnosis not present

## 2021-08-24 DIAGNOSIS — I10 Essential (primary) hypertension: Secondary | ICD-10-CM | POA: Diagnosis not present

## 2021-08-24 DIAGNOSIS — H2512 Age-related nuclear cataract, left eye: Secondary | ICD-10-CM | POA: Diagnosis not present

## 2021-08-24 DIAGNOSIS — Z86718 Personal history of other venous thrombosis and embolism: Secondary | ICD-10-CM | POA: Diagnosis not present

## 2021-08-24 DIAGNOSIS — H3530 Unspecified macular degeneration: Secondary | ICD-10-CM | POA: Diagnosis not present

## 2021-08-24 DIAGNOSIS — E785 Hyperlipidemia, unspecified: Secondary | ICD-10-CM | POA: Diagnosis not present

## 2021-08-24 DIAGNOSIS — Z888 Allergy status to other drugs, medicaments and biological substances status: Secondary | ICD-10-CM | POA: Diagnosis not present

## 2021-08-24 DIAGNOSIS — Z79899 Other long term (current) drug therapy: Secondary | ICD-10-CM | POA: Diagnosis not present

## 2021-08-24 DIAGNOSIS — Z880 Allergy status to penicillin: Secondary | ICD-10-CM | POA: Diagnosis not present

## 2021-08-24 DIAGNOSIS — Z9841 Cataract extraction status, right eye: Secondary | ICD-10-CM | POA: Diagnosis not present

## 2021-08-24 DIAGNOSIS — Z882 Allergy status to sulfonamides status: Secondary | ICD-10-CM | POA: Diagnosis not present

## 2021-08-24 DIAGNOSIS — I350 Nonrheumatic aortic (valve) stenosis: Secondary | ICD-10-CM | POA: Diagnosis not present

## 2021-08-24 DIAGNOSIS — H5709 Other anomalies of pupillary function: Secondary | ICD-10-CM | POA: Diagnosis not present

## 2021-08-24 DIAGNOSIS — H409 Unspecified glaucoma: Secondary | ICD-10-CM | POA: Diagnosis not present

## 2021-08-24 DIAGNOSIS — R011 Cardiac murmur, unspecified: Secondary | ICD-10-CM | POA: Diagnosis not present

## 2021-09-03 DIAGNOSIS — M7918 Myalgia, other site: Secondary | ICD-10-CM | POA: Diagnosis not present

## 2021-09-03 DIAGNOSIS — M6281 Muscle weakness (generalized): Secondary | ICD-10-CM | POA: Diagnosis not present

## 2021-09-03 DIAGNOSIS — R293 Abnormal posture: Secondary | ICD-10-CM | POA: Diagnosis not present

## 2021-09-03 DIAGNOSIS — M2560 Stiffness of unspecified joint, not elsewhere classified: Secondary | ICD-10-CM | POA: Diagnosis not present

## 2021-09-03 DIAGNOSIS — M25511 Pain in right shoulder: Secondary | ICD-10-CM | POA: Diagnosis not present

## 2021-09-04 DIAGNOSIS — M25511 Pain in right shoulder: Secondary | ICD-10-CM | POA: Diagnosis not present

## 2021-09-04 DIAGNOSIS — M6281 Muscle weakness (generalized): Secondary | ICD-10-CM | POA: Diagnosis not present

## 2021-09-04 DIAGNOSIS — M2560 Stiffness of unspecified joint, not elsewhere classified: Secondary | ICD-10-CM | POA: Diagnosis not present

## 2021-09-04 DIAGNOSIS — M7918 Myalgia, other site: Secondary | ICD-10-CM | POA: Diagnosis not present

## 2021-09-04 DIAGNOSIS — R293 Abnormal posture: Secondary | ICD-10-CM | POA: Diagnosis not present

## 2021-09-08 DIAGNOSIS — R293 Abnormal posture: Secondary | ICD-10-CM | POA: Diagnosis not present

## 2021-09-08 DIAGNOSIS — M25511 Pain in right shoulder: Secondary | ICD-10-CM | POA: Diagnosis not present

## 2021-09-08 DIAGNOSIS — M2560 Stiffness of unspecified joint, not elsewhere classified: Secondary | ICD-10-CM | POA: Diagnosis not present

## 2021-09-08 DIAGNOSIS — M7918 Myalgia, other site: Secondary | ICD-10-CM | POA: Diagnosis not present

## 2021-09-08 DIAGNOSIS — M6281 Muscle weakness (generalized): Secondary | ICD-10-CM | POA: Diagnosis not present

## 2021-09-10 DIAGNOSIS — M6281 Muscle weakness (generalized): Secondary | ICD-10-CM | POA: Diagnosis not present

## 2021-09-10 DIAGNOSIS — M7918 Myalgia, other site: Secondary | ICD-10-CM | POA: Diagnosis not present

## 2021-09-10 DIAGNOSIS — R293 Abnormal posture: Secondary | ICD-10-CM | POA: Diagnosis not present

## 2021-09-10 DIAGNOSIS — M25511 Pain in right shoulder: Secondary | ICD-10-CM | POA: Diagnosis not present

## 2021-09-10 DIAGNOSIS — M2560 Stiffness of unspecified joint, not elsewhere classified: Secondary | ICD-10-CM | POA: Diagnosis not present

## 2021-09-11 DIAGNOSIS — I10 Essential (primary) hypertension: Secondary | ICD-10-CM | POA: Diagnosis not present

## 2021-09-11 DIAGNOSIS — H547 Unspecified visual loss: Secondary | ICD-10-CM | POA: Diagnosis not present

## 2021-09-11 DIAGNOSIS — E782 Mixed hyperlipidemia: Secondary | ICD-10-CM | POA: Diagnosis not present

## 2021-09-11 DIAGNOSIS — E1169 Type 2 diabetes mellitus with other specified complication: Secondary | ICD-10-CM | POA: Diagnosis not present

## 2021-09-15 DIAGNOSIS — M7918 Myalgia, other site: Secondary | ICD-10-CM | POA: Diagnosis not present

## 2021-09-15 DIAGNOSIS — M25511 Pain in right shoulder: Secondary | ICD-10-CM | POA: Diagnosis not present

## 2021-09-15 DIAGNOSIS — M6281 Muscle weakness (generalized): Secondary | ICD-10-CM | POA: Diagnosis not present

## 2021-09-15 DIAGNOSIS — M2560 Stiffness of unspecified joint, not elsewhere classified: Secondary | ICD-10-CM | POA: Diagnosis not present

## 2021-09-15 DIAGNOSIS — R293 Abnormal posture: Secondary | ICD-10-CM | POA: Diagnosis not present

## 2021-09-18 DIAGNOSIS — M25511 Pain in right shoulder: Secondary | ICD-10-CM | POA: Diagnosis not present

## 2021-09-18 DIAGNOSIS — R293 Abnormal posture: Secondary | ICD-10-CM | POA: Diagnosis not present

## 2021-09-18 DIAGNOSIS — M6281 Muscle weakness (generalized): Secondary | ICD-10-CM | POA: Diagnosis not present

## 2021-09-18 DIAGNOSIS — M7918 Myalgia, other site: Secondary | ICD-10-CM | POA: Diagnosis not present

## 2021-09-18 DIAGNOSIS — M2560 Stiffness of unspecified joint, not elsewhere classified: Secondary | ICD-10-CM | POA: Diagnosis not present

## 2021-09-24 DIAGNOSIS — E785 Hyperlipidemia, unspecified: Secondary | ICD-10-CM | POA: Diagnosis not present

## 2021-09-24 DIAGNOSIS — E1169 Type 2 diabetes mellitus with other specified complication: Secondary | ICD-10-CM | POA: Diagnosis not present

## 2021-09-24 DIAGNOSIS — I10 Essential (primary) hypertension: Secondary | ICD-10-CM | POA: Diagnosis not present

## 2021-11-24 DIAGNOSIS — Z4689 Encounter for fitting and adjustment of other specified devices: Secondary | ICD-10-CM | POA: Diagnosis not present

## 2021-11-24 DIAGNOSIS — R059 Cough, unspecified: Secondary | ICD-10-CM | POA: Diagnosis not present

## 2021-11-24 DIAGNOSIS — R0981 Nasal congestion: Secondary | ICD-10-CM | POA: Diagnosis not present

## 2021-11-25 DIAGNOSIS — Z1231 Encounter for screening mammogram for malignant neoplasm of breast: Secondary | ICD-10-CM | POA: Diagnosis not present

## 2021-11-27 DIAGNOSIS — I10 Essential (primary) hypertension: Secondary | ICD-10-CM | POA: Diagnosis not present

## 2021-11-27 DIAGNOSIS — E782 Mixed hyperlipidemia: Secondary | ICD-10-CM | POA: Diagnosis not present

## 2021-11-27 DIAGNOSIS — H547 Unspecified visual loss: Secondary | ICD-10-CM | POA: Diagnosis not present

## 2021-11-27 DIAGNOSIS — U071 COVID-19: Secondary | ICD-10-CM | POA: Diagnosis not present

## 2022-01-11 DIAGNOSIS — E1169 Type 2 diabetes mellitus with other specified complication: Secondary | ICD-10-CM | POA: Diagnosis not present

## 2022-01-11 DIAGNOSIS — I10 Essential (primary) hypertension: Secondary | ICD-10-CM | POA: Diagnosis not present

## 2022-01-11 DIAGNOSIS — N390 Urinary tract infection, site not specified: Secondary | ICD-10-CM | POA: Diagnosis not present

## 2022-01-23 DIAGNOSIS — I10 Essential (primary) hypertension: Secondary | ICD-10-CM | POA: Diagnosis not present

## 2022-01-23 DIAGNOSIS — E1169 Type 2 diabetes mellitus with other specified complication: Secondary | ICD-10-CM | POA: Diagnosis not present

## 2022-01-23 DIAGNOSIS — E782 Mixed hyperlipidemia: Secondary | ICD-10-CM | POA: Diagnosis not present

## 2022-03-03 DIAGNOSIS — I371 Nonrheumatic pulmonary valve insufficiency: Secondary | ICD-10-CM | POA: Diagnosis not present

## 2022-03-03 DIAGNOSIS — I361 Nonrheumatic tricuspid (valve) insufficiency: Secondary | ICD-10-CM | POA: Diagnosis not present

## 2022-03-03 DIAGNOSIS — I272 Pulmonary hypertension, unspecified: Secondary | ICD-10-CM | POA: Diagnosis not present

## 2022-03-12 DIAGNOSIS — E782 Mixed hyperlipidemia: Secondary | ICD-10-CM | POA: Diagnosis not present

## 2022-03-12 DIAGNOSIS — E1169 Type 2 diabetes mellitus with other specified complication: Secondary | ICD-10-CM | POA: Diagnosis not present

## 2022-03-12 DIAGNOSIS — I10 Essential (primary) hypertension: Secondary | ICD-10-CM | POA: Diagnosis not present

## 2022-03-12 DIAGNOSIS — E7800 Pure hypercholesterolemia, unspecified: Secondary | ICD-10-CM | POA: Diagnosis not present

## 2022-04-09 DIAGNOSIS — I350 Nonrheumatic aortic (valve) stenosis: Secondary | ICD-10-CM | POA: Diagnosis not present

## 2022-05-20 DIAGNOSIS — J209 Acute bronchitis, unspecified: Secondary | ICD-10-CM | POA: Diagnosis not present

## 2022-05-31 DIAGNOSIS — Z4689 Encounter for fitting and adjustment of other specified devices: Secondary | ICD-10-CM | POA: Diagnosis not present

## 2022-05-31 DIAGNOSIS — J302 Other seasonal allergic rhinitis: Secondary | ICD-10-CM | POA: Diagnosis not present

## 2022-06-11 DIAGNOSIS — Z0001 Encounter for general adult medical examination with abnormal findings: Secondary | ICD-10-CM | POA: Diagnosis not present

## 2022-06-11 DIAGNOSIS — E785 Hyperlipidemia, unspecified: Secondary | ICD-10-CM | POA: Diagnosis not present

## 2022-06-11 DIAGNOSIS — E1169 Type 2 diabetes mellitus with other specified complication: Secondary | ICD-10-CM | POA: Diagnosis not present

## 2022-06-11 DIAGNOSIS — I10 Essential (primary) hypertension: Secondary | ICD-10-CM | POA: Diagnosis not present

## 2022-07-21 DIAGNOSIS — H52203 Unspecified astigmatism, bilateral: Secondary | ICD-10-CM | POA: Diagnosis not present

## 2022-07-21 DIAGNOSIS — H5201 Hypermetropia, right eye: Secondary | ICD-10-CM | POA: Diagnosis not present

## 2022-07-21 DIAGNOSIS — H04123 Dry eye syndrome of bilateral lacrimal glands: Secondary | ICD-10-CM | POA: Diagnosis not present

## 2022-07-21 DIAGNOSIS — H5212 Myopia, left eye: Secondary | ICD-10-CM | POA: Diagnosis not present

## 2022-07-21 DIAGNOSIS — H40023 Open angle with borderline findings, high risk, bilateral: Secondary | ICD-10-CM | POA: Diagnosis not present

## 2022-07-21 DIAGNOSIS — H524 Presbyopia: Secondary | ICD-10-CM | POA: Diagnosis not present

## 2022-07-21 DIAGNOSIS — H43393 Other vitreous opacities, bilateral: Secondary | ICD-10-CM | POA: Diagnosis not present

## 2022-10-12 DIAGNOSIS — G5610 Other lesions of median nerve, unspecified upper limb: Secondary | ICD-10-CM | POA: Diagnosis not present

## 2022-10-12 DIAGNOSIS — G5601 Carpal tunnel syndrome, right upper limb: Secondary | ICD-10-CM | POA: Diagnosis not present

## 2022-10-12 DIAGNOSIS — E559 Vitamin D deficiency, unspecified: Secondary | ICD-10-CM | POA: Diagnosis not present

## 2022-10-12 DIAGNOSIS — I10 Essential (primary) hypertension: Secondary | ICD-10-CM | POA: Diagnosis not present

## 2022-10-12 DIAGNOSIS — E1169 Type 2 diabetes mellitus with other specified complication: Secondary | ICD-10-CM | POA: Diagnosis not present

## 2022-10-12 DIAGNOSIS — R410 Disorientation, unspecified: Secondary | ICD-10-CM | POA: Diagnosis not present

## 2022-10-15 DIAGNOSIS — N390 Urinary tract infection, site not specified: Secondary | ICD-10-CM | POA: Diagnosis not present

## 2022-10-30 ENCOUNTER — Emergency Department (HOSPITAL_COMMUNITY): Payer: Medicare Other

## 2022-10-30 ENCOUNTER — Encounter (HOSPITAL_COMMUNITY): Payer: Self-pay | Admitting: Emergency Medicine

## 2022-10-30 ENCOUNTER — Inpatient Hospital Stay (HOSPITAL_COMMUNITY)
Admission: EM | Admit: 2022-10-30 | Discharge: 2022-11-01 | DRG: 641 | Disposition: A | Discharge status: Home / Self Care | Payer: Medicare Other | Attending: Internal Medicine | Admitting: Internal Medicine

## 2022-10-30 ENCOUNTER — Other Ambulatory Visit: Payer: Self-pay

## 2022-10-30 DIAGNOSIS — E860 Dehydration: Secondary | ICD-10-CM | POA: Diagnosis not present

## 2022-10-30 DIAGNOSIS — E7800 Pure hypercholesterolemia, unspecified: Secondary | ICD-10-CM | POA: Diagnosis present

## 2022-10-30 DIAGNOSIS — Z7952 Long term (current) use of systemic steroids: Secondary | ICD-10-CM | POA: Diagnosis not present

## 2022-10-30 DIAGNOSIS — I10 Essential (primary) hypertension: Secondary | ICD-10-CM | POA: Diagnosis not present

## 2022-10-30 DIAGNOSIS — I272 Pulmonary hypertension, unspecified: Secondary | ICD-10-CM | POA: Diagnosis present

## 2022-10-30 DIAGNOSIS — Z880 Allergy status to penicillin: Secondary | ICD-10-CM | POA: Diagnosis not present

## 2022-10-30 DIAGNOSIS — Z882 Allergy status to sulfonamides status: Secondary | ICD-10-CM

## 2022-10-30 DIAGNOSIS — R5381 Other malaise: Secondary | ICD-10-CM | POA: Diagnosis not present

## 2022-10-30 DIAGNOSIS — I371 Nonrheumatic pulmonary valve insufficiency: Secondary | ICD-10-CM | POA: Diagnosis not present

## 2022-10-30 DIAGNOSIS — R079 Chest pain, unspecified: Secondary | ICD-10-CM | POA: Diagnosis not present

## 2022-10-30 DIAGNOSIS — R404 Transient alteration of awareness: Secondary | ICD-10-CM | POA: Diagnosis not present

## 2022-10-30 DIAGNOSIS — Z888 Allergy status to other drugs, medicaments and biological substances status: Secondary | ICD-10-CM

## 2022-10-30 DIAGNOSIS — E861 Hypovolemia: Principal | ICD-10-CM | POA: Diagnosis present

## 2022-10-30 DIAGNOSIS — R0902 Hypoxemia: Secondary | ICD-10-CM | POA: Diagnosis not present

## 2022-10-30 DIAGNOSIS — I358 Other nonrheumatic aortic valve disorders: Secondary | ICD-10-CM | POA: Diagnosis not present

## 2022-10-30 DIAGNOSIS — R55 Syncope and collapse: Principal | ICD-10-CM | POA: Diagnosis present

## 2022-10-30 DIAGNOSIS — N179 Acute kidney failure, unspecified: Secondary | ICD-10-CM | POA: Diagnosis not present

## 2022-10-30 DIAGNOSIS — Z79899 Other long term (current) drug therapy: Secondary | ICD-10-CM

## 2022-10-30 DIAGNOSIS — R531 Weakness: Secondary | ICD-10-CM | POA: Diagnosis not present

## 2022-10-30 DIAGNOSIS — Z743 Need for continuous supervision: Secondary | ICD-10-CM | POA: Diagnosis not present

## 2022-10-30 HISTORY — DX: Essential (primary) hypertension: I10

## 2022-10-30 HISTORY — DX: Pure hypercholesterolemia, unspecified: E78.00

## 2022-10-30 LAB — BASIC METABOLIC PANEL
Anion gap: 14 (ref 5–15)
BUN: 21 mg/dL (ref 8–23)
CO2: 22 mmol/L (ref 22–32)
Calcium: 9.9 mg/dL (ref 8.9–10.3)
Chloride: 106 mmol/L (ref 98–111)
Creatinine, Ser: 1.32 mg/dL — ABNORMAL HIGH (ref 0.44–1.00)
GFR, Estimated: 39 mL/min — ABNORMAL LOW (ref >60)
Glucose, Bld: 113 mg/dL — ABNORMAL HIGH (ref 70–99)
Potassium: 3.6 mmol/L (ref 3.5–5.1)
Sodium: 142 mmol/L (ref 135–145)

## 2022-10-30 LAB — CBC
HCT: 42.5 % (ref 36.0–46.0)
Hemoglobin: 13.7 g/dL (ref 12.0–15.0)
MCH: 29.4 pg (ref 26.0–34.0)
MCHC: 32.2 g/dL (ref 30.0–36.0)
MCV: 91.2 fL (ref 80.0–100.0)
Platelets: 244 10*3/uL (ref 150–400)
RBC: 4.66 MIL/uL (ref 3.87–5.11)
RDW: 15 % (ref 11.5–15.5)
WBC: 8 10*3/uL (ref 4.0–10.5)
nRBC: 0 % (ref 0.0–0.2)

## 2022-10-30 LAB — CBG MONITORING, ED: Glucose-Capillary: 105 mg/dL — ABNORMAL HIGH (ref 70–99)

## 2022-10-30 NOTE — ED Provider Notes (Signed)
[place] EMERGENCY DEPARTMENT AT [facility] Provider Note   CSN: [id number] Arrival date & time: 10/30/22  1956     History {Add pertinent medical, surgical, social history, OB history to HPI:1} Chief Complaint  Patient presents with   Near Syncope    Kelly Dodson is a 88 y.o. female.  88 year old female with a history of moderate aortic stenosis, hypertension, and hyperlipidemia who presents to the emergency department after syncopal event.  Says that she was sitting down at a family event when she started to lose consciousness.  Says that her vision started going out and that she nearly lost consciousness.  Daughter says she started to get weak but didn't fall. Was able to talk during this. Did not have any chest pain or shortness of breath associated with it.  Says that she has been eating and drinking normally today.  Had moderate aortic stenosis with a cardiology appointment in March of this year.  03/03/2022 Echo SUMMARY  Normal LV size, wall thickness, wall motion and systolic function with  ejection fraction 55-60%  The right ventricle is normal in size and function.  The atria are normal in size.  Diffuse calcification of the aortic valve.  There is moderate aortic stenosis.  AS dimensionless index (VTI) is 0.30.  Aortic valve mean pressure gradient is 20 mmHg.  There is trace mitral regurgitation.  There is mild tricuspid regurgitation.  Mild pulmonary hypertension.  Mild pulmonic valvular regurgitation.  IVC size was normal.  There is no pericardial effusion.  Compared to prior study done on 06/27/2020, there is no significant change.         Home Medications Prior to Admission medications   Medication Sig Start Date End Date Taking? Authorizing Provider  amLODipine (NORVASC) 10 MG tablet Take 10 mg by mouth daily. 06/25/04   [provider]  azithromycin (ZITHROMAX) 250 MG tablet Take 250 mg by mouth daily. 04/24/15   [provider]   bumetanide (BUMEX) 0.5 MG tablet Take 0.5 mg by mouth daily.    [provider]  DM-Doxylamine-Acetaminophen (NYQUIL COLD & FLU PO) Take 2 capsules by mouth daily as needed (FOR COLD).    [provider]  ezetimibe (ZETIA) 10 MG tablet Take 10 mg by mouth daily. 06/01/04   [provider]  guaiFENesin (MUCINEX) 600 MG 12 hr tablet Take 600 mg by mouth 2 (two) times daily as needed for cough or to loosen phlegm.    [provider]  mirabegron ER (MYRBETRIQ) 50 MG TB24 tablet Take 1 tablet (50 mg total) by mouth daily. 01/03/16   Raeford Razor, MD  Multiple Vitamin (MULTIVITAMIN WITH MINERALS) TABS tablet Take 1 tablet by mouth daily.    [provider]  predniSONE (DELTASONE) 10 MG tablet Take 10 mg by mouth daily. 04/24/15   [provider]      Allergies    Patient has no known allergies.    Review of Systems   Review of Systems  Physical Exam Updated Vital Signs BP (!) 145/60   Pulse 77   Temp 98.2 F (36.8 C)   Resp 16   Ht 5\' 3"  (1.6 m)   Wt 63.5 kg   SpO2 95%   BMI 24.80 kg/m  Physical Exam Vitals and nursing note reviewed.  Constitutional:      General: She is not in acute distress.    Appearance: She is well-developed.  HENT:     Head: Normocephalic and atraumatic.  Right Ear: External ear normal.     Left Ear: External ear normal.     Nose: Nose normal.  Eyes:     Extraocular Movements: Extraocular movements intact.     Conjunctiva/sclera: Conjunctivae normal.     Pupils: Pupils are equal, round, and reactive to light.  Cardiovascular:     Rate and Rhythm: Normal rate and regular rhythm.     Heart sounds: Murmur heard.  Pulmonary:     Effort: Pulmonary effort is normal. No respiratory distress.     Breath sounds: Normal breath sounds.  Musculoskeletal:     Cervical back: Normal range of motion and neck supple.     Right lower leg: No edema.     Left lower leg: No edema.  Skin:    General: Skin is warm  and dry.  Neurological:     Mental Status: She is alert and oriented to person, place, and time. Mental status is at baseline.     Cranial Nerves: No cranial nerve deficit.     Sensory: No sensory deficit.     Motor: No weakness.  Psychiatric:        Mood and Affect: Mood normal.     ED Results / Procedures / Treatments   Labs (all labs ordered are listed, but only abnormal results are displayed) Labs Reviewed  CBG MONITORING, ED - Abnormal; Notable for the following components:      Result Value   Glucose-Capillary 105 (*)    All other components within normal limits  CBC  BASIC METABOLIC PANEL  URINALYSIS, ROUTINE W REFLEX MICROSCOPIC    EKG None  Radiology No results found.  Procedures Procedures  {Document cardiac monitor, telemetry assessment procedure when appropriate:1}  Medications Ordered in ED Medications - No data to display  ED Course/ Medical Decision Making/ A&P Clinical Course as of 10/30/22 2235  Sat Oct 30, 2022  2222 Creatinine(!): 1.32 1.04 on 06/2019 [RP]    Clinical Course User Index [RP] Rondel Baton, MD   {   Click here for ABCD2, HEART and other calculatorsREFRESH Note before signing :1}                              Medical Decision Making Amount and/or Complexity of Data Reviewed Labs: ordered. Decision-making details documented in ED Course. Radiology: ordered.   ***  {Document critical care time when appropriate:1} {Document review of labs and clinical decision tools ie heart score, Chads2Vasc2 etc:1}  {Document your independent review of radiology images, and any outside records:1} {Document your discussion with family members, caretakers, and with consultants:1} {Document social determinants of health affecting pt's care:1} {Document your decision making why or why not admission, treatments were needed:1} Final Clinical Impression(s) / ED Diagnoses Final diagnoses:  None    Rx / DC Orders ED Discharge Orders      None

## 2022-10-30 NOTE — ED Triage Notes (Signed)
Patient BIB EMS for evaluation of near syncopal episode.  Pt reports she was at a family birthday party and was sitting at a table.  Felt like "everything was going dark" and family states she "almost passed out."  No reports of headache or weakness.  Did report doing yard work and not drinking/eating a lot today prior to going to party.

## 2022-10-30 NOTE — ED Notes (Signed)
Pt told paramedic that she had been doing yard work today, had not drank a lot of water, and had felt cramping in her lower legs. She stated that she almost passed out at her nephews party. Pt told paramedic that she felt back to normal now.

## 2022-10-30 NOTE — ED Notes (Signed)
Patient to CT.

## 2022-10-31 ENCOUNTER — Observation Stay (HOSPITAL_COMMUNITY): Payer: Medicare Other

## 2022-10-31 DIAGNOSIS — E861 Hypovolemia: Secondary | ICD-10-CM | POA: Diagnosis present

## 2022-10-31 DIAGNOSIS — Z79899 Other long term (current) drug therapy: Secondary | ICD-10-CM | POA: Diagnosis not present

## 2022-10-31 DIAGNOSIS — R5381 Other malaise: Secondary | ICD-10-CM | POA: Diagnosis present

## 2022-10-31 DIAGNOSIS — I272 Pulmonary hypertension, unspecified: Secondary | ICD-10-CM | POA: Diagnosis present

## 2022-10-31 DIAGNOSIS — I371 Nonrheumatic pulmonary valve insufficiency: Secondary | ICD-10-CM | POA: Diagnosis present

## 2022-10-31 DIAGNOSIS — Z882 Allergy status to sulfonamides status: Secondary | ICD-10-CM | POA: Diagnosis not present

## 2022-10-31 DIAGNOSIS — E7800 Pure hypercholesterolemia, unspecified: Secondary | ICD-10-CM | POA: Diagnosis present

## 2022-10-31 DIAGNOSIS — Z880 Allergy status to penicillin: Secondary | ICD-10-CM | POA: Diagnosis not present

## 2022-10-31 DIAGNOSIS — I358 Other nonrheumatic aortic valve disorders: Secondary | ICD-10-CM | POA: Diagnosis present

## 2022-10-31 DIAGNOSIS — I10 Essential (primary) hypertension: Secondary | ICD-10-CM | POA: Diagnosis present

## 2022-10-31 DIAGNOSIS — E860 Dehydration: Secondary | ICD-10-CM | POA: Diagnosis present

## 2022-10-31 DIAGNOSIS — R55 Syncope and collapse: Secondary | ICD-10-CM

## 2022-10-31 DIAGNOSIS — N179 Acute kidney failure, unspecified: Secondary | ICD-10-CM | POA: Diagnosis present

## 2022-10-31 DIAGNOSIS — Z7952 Long term (current) use of systemic steroids: Secondary | ICD-10-CM | POA: Diagnosis not present

## 2022-10-31 DIAGNOSIS — Z888 Allergy status to other drugs, medicaments and biological substances status: Secondary | ICD-10-CM | POA: Diagnosis not present

## 2022-10-31 LAB — CBC
HCT: 38.9 % (ref 36.0–46.0)
Hemoglobin: 12.4 g/dL (ref 12.0–15.0)
MCH: 29.5 pg (ref 26.0–34.0)
MCHC: 31.9 g/dL (ref 30.0–36.0)
MCV: 92.6 fL (ref 80.0–100.0)
Platelets: 197 10*3/uL (ref 150–400)
RBC: 4.2 MIL/uL (ref 3.87–5.11)
RDW: 15 % (ref 11.5–15.5)
WBC: 4.8 10*3/uL (ref 4.0–10.5)
nRBC: 0 % (ref 0.0–0.2)

## 2022-10-31 LAB — CREATININE, SERUM
Creatinine, Ser: 1.08 mg/dL — ABNORMAL HIGH (ref 0.44–1.00)
GFR, Estimated: 50 mL/min — ABNORMAL LOW (ref >60)

## 2022-10-31 LAB — RENAL FUNCTION PANEL
Albumin: 3.3 g/dL — ABNORMAL LOW (ref 3.5–5.0)
Anion gap: 13 (ref 5–15)
BUN: 19 mg/dL (ref 8–23)
CO2: 22 mmol/L (ref 22–32)
Calcium: 9.2 mg/dL (ref 8.9–10.3)
Chloride: 104 mmol/L (ref 98–111)
Creatinine, Ser: 1.21 mg/dL — ABNORMAL HIGH (ref 0.44–1.00)
GFR, Estimated: 44 mL/min — ABNORMAL LOW (ref >60)
Glucose, Bld: 133 mg/dL — ABNORMAL HIGH (ref 70–99)
Phosphorus: 3.1 mg/dL (ref 2.5–4.6)
Potassium: 3.5 mmol/L (ref 3.5–5.1)
Sodium: 139 mmol/L (ref 135–145)

## 2022-10-31 LAB — URINALYSIS, ROUTINE W REFLEX MICROSCOPIC
Bacteria, UA: NONE SEEN
Bilirubin Urine: NEGATIVE
Glucose, UA: NEGATIVE mg/dL
Hgb urine dipstick: NEGATIVE
Ketones, ur: NEGATIVE mg/dL
Nitrite: NEGATIVE
Protein, ur: NEGATIVE mg/dL
Specific Gravity, Urine: 1.021 (ref 1.005–1.030)
pH: 5 (ref 5.0–8.0)

## 2022-10-31 LAB — MAGNESIUM: Magnesium: 2 mg/dL (ref 1.7–2.4)

## 2022-10-31 LAB — ECHOCARDIOGRAM COMPLETE
AR max vel: 1 cm2
AV Area VTI: 1.06 cm2
AV Area mean vel: 0.91 cm2
AV Mean grad: 24 mm[Hg]
AV Peak grad: 37.9 mm[Hg]
Ao pk vel: 3.08 m/s
Area-P 1/2: 3.48 cm2
Height: 63 in
S' Lateral: 2.8 cm
Weight: 2240 [oz_av]

## 2022-10-31 LAB — TROPONIN I (HIGH SENSITIVITY): Troponin I (High Sensitivity): 9 ng/L (ref <18)

## 2022-10-31 MED ORDER — ACETAMINOPHEN 325 MG PO TABS
650.0000 mg | ORAL_TABLET | Freq: Four times a day (QID) | ORAL | Status: DC | PRN
  Filled 2022-10-31: qty 2

## 2022-10-31 MED ORDER — ENOXAPARIN SODIUM 30 MG/0.3ML IJ SOSY
30.0000 mg | PREFILLED_SYRINGE | INTRAMUSCULAR | Status: DC
  Administered 2022-10-31: 30 mg via SUBCUTANEOUS
  Filled 2022-10-31: qty 0.3

## 2022-10-31 MED ORDER — ENOXAPARIN SODIUM 40 MG/0.4ML IJ SOSY
40.0000 mg | PREFILLED_SYRINGE | INTRAMUSCULAR | Status: DC
  Administered 2022-11-01: 40 mg via SUBCUTANEOUS
  Filled 2022-10-31: qty 0.4

## 2022-10-31 MED ORDER — EZETIMIBE 10 MG PO TABS
10.0000 mg | ORAL_TABLET | Freq: Every day | ORAL | Status: DC
  Administered 2022-10-31 – 2022-11-01 (×2): 10 mg via ORAL
  Filled 2022-10-31 (×2): qty 1

## 2022-10-31 MED ORDER — POTASSIUM CHLORIDE IN NACL 20-0.9 MEQ/L-% IV SOLN
INTRAVENOUS | Status: DC
  Filled 2022-10-31 (×2): qty 1000

## 2022-10-31 MED ORDER — PROCHLORPERAZINE EDISYLATE 10 MG/2ML IJ SOLN: 5.0000 mg | Freq: Four times a day (QID) | INTRAMUSCULAR | Status: DC | PRN

## 2022-10-31 MED ORDER — POLYETHYLENE GLYCOL 3350 17 G PO PACK: 17.0000 g | PACK | Freq: Every day | ORAL | Status: DC | PRN

## 2022-10-31 MED ORDER — LACTATED RINGERS IV SOLN: INTRAVENOUS | Status: DC

## 2022-10-31 MED ORDER — MELATONIN 5 MG PO TABS
5.0000 mg | ORAL_TABLET | Freq: Every evening | ORAL | Status: DC | PRN
  Filled 2022-10-31: qty 1

## 2022-10-31 NOTE — Plan of Care (Signed)

## 2022-10-31 NOTE — ED Notes (Signed)
PT well appearing upon transport. 

## 2022-10-31 NOTE — Evaluation (Signed)
Physical Therapy Evaluation Patient Details Name: Kelly Dodson MRN: [id number] DOB: March 05, 1934 Today's Date: 10/31/2022  History of Present Illness  Pt is an 88 y/o F presenting to ED on 10/5 with near syncopal episode while at a birthday party. PMH includes nonrheumatic aortic valve stenosis, HTN   Clinical Impression  Pt admitted with above. PTA pt lived alone, was indep and driving. Pt now presenting with mild unsteadiness with ambulation without AD. Recommended pt stay with one of her daughters for a couple of days until she feels more like her self and is more confident/steady on her feet. Pt reports she will most likely stay with her daughter Kelly Dodson in [place] for a few days. I don't anticipate pt to need follow up PT upon d/c. Acute PT to cont to follow.        If plan is discharge home, recommend the following: A little help with walking and/or transfers;A little help with bathing/dressing/bathroom;Assist for transportation;Help with stairs or ramp for entrance   Can travel by private vehicle        Equipment Recommendations None recommended by PT  Recommendations for Other Services       Functional Status Assessment Patient has had a recent decline in their functional status and demonstrates the ability to make significant improvements in function in a reasonable and predictable amount of time.     Precautions / Restrictions Precautions Precautions: Fall Restrictions Weight Bearing Restrictions: No      Mobility  Bed Mobility Overal bed mobility: Modified Independent             General bed mobility comments: HOB flat, brought self up into long sit and then pivoted to EOB    Transfers Overall transfer level: Needs assistance Equipment used: None Transfers: Sit to/from Stand Sit to Stand: Supervision           General transfer comment: increased time, pushed up from bed, narrow base of support    Ambulation/Gait Ambulation/Gait assistance: Contact  guard assist Gait Distance (Feet): 120 Feet Assistive device: None Gait Pattern/deviations: Step-through pattern, Decreased stride length, Narrow base of support Gait velocity: dec Gait velocity interpretation: 1.31 - 2.62 ft/sec, indicative of limited community ambulator   General Gait Details: pt with mild unsteadiness, occasional vearing L/R, decreased cadence, mild SOB noted however pt denies feeling SOB  Stairs            Wheelchair Mobility     Tilt Bed    Modified Rankin (Stroke Patients Only)       Balance Overall balance assessment: Mild deficits observed, not formally tested                                           Pertinent Vitals/Pain Pain Assessment Pain Assessment: No/denies pain    Home Living Family/patient expects to be discharged to:: Private residence Living Arrangements: Alone Available Help at Discharge: Family;Friend(s);Available PRN/intermittently (states she will probably stay with one of her 3 daughters) Type of Home: House Home Access: Stairs to enter Entrance Stairs-Rails: Right Entrance Stairs-Number of Steps: 4 front, no steps in the back   Home Layout: One level Home Equipment: Cane - single point Additional Comments: pt reports she will most likely stay with her daughter Kelly Dodson in [place] because "they'll all be worried sick". Pt with 3 daughters in which she reports she could stay with any of  them    Prior Function Prior Level of Function : Independent/Modified Independent;Driving             Mobility Comments: no AD use ADLs Comments: ind     Extremity/Trunk Assessment   Upper Extremity Assessment Upper Extremity Assessment: Defer to OT evaluation    Lower Extremity Assessment Lower Extremity Assessment: Generalized weakness    Cervical / Trunk Assessment Cervical / Trunk Assessment: Normal  Communication   Communication Communication: No apparent difficulties  Cognition Arousal:  Alert Behavior During Therapy: WFL for tasks assessed/performed Overall Cognitive Status: No family/caregiver present to determine baseline cognitive functioning                                 General Comments: A & O x3, stated [facility] hospital, re-oriented to [facility] and was able to recall at end of session, at times needing incr cues to follow commands        General Comments General comments (skin integrity, edema, etc.): VSS, mild SOB with amb, denies dizziness/lightheadedness    Exercises     Assessment/Plan    PT Assessment Patient needs continued PT services  PT Problem List Decreased strength;Decreased activity tolerance;Decreased balance;Decreased mobility       PT Treatment Interventions DME instruction;Gait training;Stair training;Functional mobility training;Therapeutic activities;Therapeutic exercise;Balance training    PT Goals (Current goals can be found in the Care Plan section)  Acute Rehab PT Goals Patient Stated Goal: home PT Goal Formulation: With patient Time For Goal Achievement: 11/14/22 Potential to Achieve Goals: Good Additional Goals Additional Goal #1: Pt to score >19 on DGI to indicate minimal falls risk.    Frequency Min 1X/week     Co-evaluation               AM-PAC PT "6 Clicks" Mobility  Outcome Measure Help needed turning from your back to your side while in a flat bed without using bedrails?: None Help needed moving from lying on your back to sitting on the side of a flat bed without using bedrails?: None Help needed moving to and from a bed to a chair (including a wheelchair)?: None Help needed standing up from a chair using your arms (e.g., wheelchair or bedside chair)?: None Help needed to walk in hospital room?: A Little Help needed climbing 3-5 steps with a railing? : A Little 6 Click Score: 22    End of Session Equipment Utilized During Treatment: Gait belt Activity Tolerance: Patient tolerated treatment  well Patient left: in chair;with call bell/phone within reach Nurse Communication: Mobility status PT Visit Diagnosis: Unsteadiness on feet (R26.81)    Time: 4098-1191 PT Time Calculation (min) (ACUTE ONLY): 22 min   Charges:   PT Evaluation $PT Eval Low Complexity: 1 Low   PT General Charges $$ ACUTE PT VISIT: 1 Visit         Lewis Shock, PT, DPT Acute Rehabilitation Services Secure chat preferred Office #: [phone]   Iona Hansen 10/31/2022, 11:23 AM

## 2022-10-31 NOTE — H&P (Signed)
History and Physical  Kelly Dodson GLO:[id number] DOB: 01-16-1935 DOA: 10/30/2022  Referring physician: Dr. Nicanor Alcon  PCP: Renaye Rakers, MD  Outpatient Specialists: Cardiology, ophthalmology. Patient coming from: Home.  Chief Complaint: Near syncope.  HPI: Kelly Dodson is a 88 y.o. female with medical history significant for nonrheumatic aortic valve stenosis, hypertension, who presents via EMS, from a birthday party, for evaluation of near syncope.  The patient was at a birthday party and suddenly felt hot and lightheaded.  She asked for a glass of cold water, felt like she was going to pass out.  Prior to going to the party, endorses doing some yard work with minimal oral intake today.  EMS was activated and she was brought to the ED for further evaluation.  In the ED, BPs are soft.  Lab work significant for elevated creatinine above baseline.  EDP requested admission for near syncope workup.  Admitted by [facility], hospitalist service.  ED Course: Temperature 98.2.  BP high into the 58, pulse 70, respiratory 23, saturation 92% on room air.  Lab studies notable for serum glucose 113, BUN 21, creatinine 1.32 with baseline creatinine 0.9 with GFR greater than 60.   Review of Systems: Review of systems as noted in the HPI. All other systems reviewed and are negative.   Past Medical History:  Diagnosis Date   Hypercholesteremia    Hypertension    History reviewed. No pertinent surgical history.  Social History:  reports that she has never smoked. She does not have any smokeless tobacco history on file. She reports that she does not drink alcohol. No history on file for drug use.   No Known Allergies  Family history: None reported.  Prior to Admission medications   Medication Sig Start Date End Date Taking? Authorizing Provider  amLODipine (NORVASC) 10 MG tablet Take 10 mg by mouth daily. 06/25/04   [provider]  azithromycin (ZITHROMAX) 250 MG tablet Take 250 mg by mouth  daily. 04/24/15   [provider]  bumetanide (BUMEX) 0.5 MG tablet Take 0.5 mg by mouth daily.    [provider]  DM-Doxylamine-Acetaminophen (NYQUIL COLD & FLU PO) Take 2 capsules by mouth daily as needed (FOR COLD).    [provider]  ezetimibe (ZETIA) 10 MG tablet Take 10 mg by mouth daily. 06/01/04   [provider]  guaiFENesin (MUCINEX) 600 MG 12 hr tablet Take 600 mg by mouth 2 (two) times daily as needed for cough or to loosen phlegm.    [provider]  mirabegron ER (MYRBETRIQ) 50 MG TB24 tablet Take 1 tablet (50 mg total) by mouth daily. 01/03/16   Raeford Razor, MD  Multiple Vitamin (MULTIVITAMIN WITH MINERALS) TABS tablet Take 1 tablet by mouth daily.    [provider]  predniSONE (DELTASONE) 10 MG tablet Take 10 mg by mouth daily. 04/24/15   [provider]    Physical Exam: BP (!) 113/51   Pulse 76   Temp 98.2 F (36.8 C)   Resp 18   Ht 5\' 3"  (1.6 m)   Wt 63.5 kg   SpO2 96%   BMI 24.80 kg/m   General: 88 y.o. year-old female well developed well nourished in no acute distress.  Alert and oriented x3. Cardiovascular: Regular rate and rhythm with no rubs or gallops.  No thyromegaly or JVD noted.  No lower extremity edema. 2/4 pulses in all 4 extremities. Respiratory: Clear to auscultation with no wheezes or rales. Good inspiratory effort. Abdomen: Soft  nontender nondistended with normal bowel sounds x4 quadrants. Muskuloskeletal: No cyanosis, clubbing or edema noted bilaterally Neuro: CN II-XII intact, strength, sensation, reflexes Skin: No ulcerative lesions noted or rashes Psychiatry: Judgement and insight appear normal. Mood is appropriate for condition and setting          Labs on Admission:  Basic Metabolic Panel: Recent Labs  Lab 10/30/22 2051  NA 142  K 3.6  CL 106  CO2 22  GLUCOSE 113*  BUN 21  CREATININE 1.32*  CALCIUM 9.9   Liver Function Tests: No results for input(s): "AST", "ALT",  "ALKPHOS", "BILITOT", "PROT", "ALBUMIN" in the last 168 hours. No results for input(s): "LIPASE", "AMYLASE" in the last 168 hours. No results for input(s): "AMMONIA" in the last 168 hours. CBC: Recent Labs  Lab 10/30/22 2051  WBC 8.0  HGB 13.7  HCT 42.5  MCV 91.2  PLT 244   Cardiac Enzymes: No results for input(s): "CKTOTAL", "CKMB", "CKMBINDEX", "TROPONINI" in the last 168 hours.  BNP (last 3 results) No results for input(s): "BNP" in the last 8760 hours.  ProBNP (last 3 results) No results for input(s): "PROBNP" in the last 8760 hours.  CBG: Recent Labs  Lab 10/30/22 2105  GLUCAP 105*    Radiological Exams on Admission: CT Head Wo Contrast  Result Date: 10/31/2022 CLINICAL DATA:  Syncope EXAM: CT HEAD WITHOUT CONTRAST TECHNIQUE: Contiguous axial images were obtained from the base of the skull through the vertex without intravenous contrast. RADIATION DOSE REDUCTION: This exam was performed according to the departmental dose-optimization program which includes automated exposure control, adjustment of the mA and/or kV according to patient size and/or use of iterative reconstruction technique. COMPARISON:  05/07/2021 FINDINGS: Brain: There is no mass, hemorrhage or extra-axial collection. The size and configuration of the ventricles and extra-axial CSF spaces are normal. There is hypoattenuation of the white matter, most commonly indicating chronic small vessel disease. 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 Vascular: No hyperdense vessel or unexpected vascular calcification. Skull: The visualized skull base, calvarium and extracranial soft tissues are normal. Sinuses/Orbits: No fluid levels or advanced mucosal thickening of the visualized paranasal sinuses. No mastoid or middle ear effusion. Normal orbits. IMPRESSION: 1. No acute intracranial abnormality. 2. 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 Electronically Signed   By: Deatra Robinson M.D.   On:  10/31/2022 00:57    EKG: I independently viewed the EKG done and my findings are as followed: Normal sinus rhythm rate of 82.  Nonspecific ST changes.  QTc 425.  Assessment/Plan Present on Admission:  Near syncope  Principal Problem:   Near syncope  Near syncope, possibly secondary to hypovolemia Rule out other causes Obtain orthostatic vital signs Follow 2D echo Start gentle IV fluid hydration LR 75 cc/h x 1 day. PT OT assessment Fall Precautions.  AKI, suspect secondary to dehydration Baseline creatinine 0.9 with GFR greater than 60 Presented with creatinine 1.32 with GFR 39 Avoid nephrotoxic agents, dehydration and hypotension Monitor urine output Start gentle IV fluid hydration LR 75 cc/h x 1 day.  Essential hypertension BPs are currently soft Hold off home oral antihypertensives Closely monitor vital signs.  Physical debility PT OT assessment Fall precautions.   Time: 75 minutes.   DVT prophylaxis: Subcu Lovenox daily  Code Status: Full code  Family Communication: None at bedside  Disposition Plan: Admitted to telemetry medical unit  Consults called: None.  Admission status: Observation   Status is: Observation    [employer]  MD [facility] Pager [phone]  If 7PM-7AM, please contact night-coverage [email or web address] Password [email or web address]  10/31/2022, 2:24 AM

## 2022-10-31 NOTE — Evaluation (Addendum)
Occupational Therapy Evaluation Patient Details Name: Kelly Dodson MRN: [id number] DOB: [birth year] Today's Date: 10/31/2022   History of Present Illness Pt is an 88 y/o F presenting to ED on 10/5 with near syncopal episode while at a birthday party. PMH includes nonrheumatic aortic valve stenosis, HTN   Clinical Impression   Pt reports ind at baseline with ADLs/functional mobility, drives, lives alone but reports her daughters live nearby and can assist PRN. Pt recalls being at her nieces birthday party when experiencing syncopal episode. Pt currently neding set up - CGA for ADLs, mod I for bed mobility and supervision for transfers without AD. Pt with mild orthostatic BP but asymptomatic during session. Pt presenting with impairments listed below, will follow acutely. Anticipate no OT follow up needs at d/c.   BP supine 125/61 (80) BP seated 117/68 (83) BP standing 93/67 (76)      If plan is discharge home, recommend the following: A little help with walking and/or transfers;A little help with bathing/dressing/bathroom;Assistance with cooking/housework;Assist for transportation    Functional Status Assessment  Patient has had a recent decline in their functional status and demonstrates the ability to make significant improvements in function in a reasonable and predictable amount of time.  Equipment Recommendations  None recommended by OT    Recommendations for Other Services PT consult     Precautions / Restrictions Precautions Precautions: Fall Restrictions Weight Bearing Restrictions: No      Mobility Bed Mobility Overal bed mobility: Modified Independent             General bed mobility comments: incr time    Transfers Overall transfer level: Needs assistance Equipment used: None Transfers: Sit to/from Stand Sit to Stand: Supervision                  Balance Overall balance assessment: Mild deficits observed, not formally tested                                          ADL either performed or assessed with clinical judgement   ADL Overall ADL's : Needs assistance/impaired Eating/Feeding: Set up;Sitting   Grooming: Set up;Standing   Upper Body Bathing: Contact guard assist   Lower Body Bathing: Contact guard assist   Upper Body Dressing : Contact guard assist   Lower Body Dressing: Contact guard assist Lower Body Dressing Details (indicate cue type and reason): pulls up socks seated on stretcher Toilet Transfer: Supervision/safety;Ambulation;Regular Social worker and Hygiene: Supervision/safety       Functional mobility during ADLs: Supervision/safety       Vision Baseline Vision/History: 1 Wears glasses Vision Assessment?: No apparent visual deficits Additional Comments: glasses not present at hospital but reports no changes in vision, reads paragraph with  few errors but glasses not present     Perception Perception: Not tested       Praxis Praxis: Not tested       Pertinent Vitals/Pain Pain Assessment Pain Assessment: No/denies pain     Extremity/Trunk Assessment Upper Extremity Assessment Upper Extremity Assessment: Generalized weakness   Lower Extremity Assessment Lower Extremity Assessment: Defer to PT evaluation   Cervical / Trunk Assessment Cervical / Trunk Assessment: Normal   Communication Communication Communication: No apparent difficulties   Cognition Arousal: Alert Behavior During Therapy: WFL for tasks assessed/performed Overall Cognitive Status: No family/caregiver present to determine baseline cognitive functioning  General Comments: A & O x4, at times needing incr cues to follow commands     General Comments  VSS on RA, see note for orthostatics    Exercises     Shoulder Instructions      Home Living Family/patient expects to be discharged to:: Private residence Living Arrangements:  Alone Available Help at Discharge: Family;Friend(s);Available PRN/intermittently Type of Home: House Home Access: Stairs to enter [employer] of Steps: 4 front, no steps in the back   Home Layout: One level     Bathroom Shower/Tub: Tub/shower unit         Home Equipment: Cane - single point          Prior Functioning/Environment Prior Level of Function : Independent/Modified Independent;Driving             Mobility Comments: no AD use ADLs Comments: ind        OT Problem List: Decreased strength;Decreased range of motion;Decreased activity tolerance;Impaired balance (sitting and/or standing)      OT Treatment/Interventions: Self-care/ADL training;Therapeutic exercise;Energy conservation;DME and/or AE instruction;Therapeutic activities;Balance training;Patient/family education;Cognitive remediation/compensation;Visual/perceptual remediation/compensation    OT Goals(Current goals can be found in the care plan section) Acute Rehab OT Goals Patient Stated Goal: none stated OT Goal Formulation: With patient Time For Goal Achievement: 11/14/22 Potential to Achieve Goals: Good ADL Goals Pt Will Perform Upper Body Dressing: Independently;sitting;standing Pt Will Perform Lower Body Dressing: Independently;sit to/from stand;sitting/lateral leans Pt Will Transfer to Toilet: Independently;ambulating;regular height toilet Pt Will Perform Tub/Shower Transfer: Shower transfer;Tub transfer;Independently;ambulating  OT Frequency: Min 1X/week    Co-evaluation              AM-PAC OT "6 Clicks" Daily Activity     Outcome Measure Help from another person eating meals?: None Help from another person taking care of personal grooming?: A Little Help from another person toileting, which includes using toliet, bedpan, or urinal?: A Little Help from another person bathing (including washing, rinsing, drying)?: A Little Help from another person to put on and taking off  regular upper body clothing?: A Little Help from another person to put on and taking off regular lower body clothing?: A Little 6 Click Score: 19   End of Session Equipment Utilized During Treatment: Gait belt Nurse Communication: Mobility status  Activity Tolerance: Patient tolerated treatment well Patient left: in bed;with call bell/phone within reach  OT Visit Diagnosis: Unsteadiness on feet (R26.81);Other abnormalities of gait and mobility (R26.89);Muscle weakness (generalized) (M62.81)                Time: 1610-9604 OT Time Calculation (min): 21 min Charges:  OT General Charges $OT Visit: 1 Visit OT Evaluation $OT Eval Low Complexity: 1 Low  Zanae Kuehnle K, OTD, OTR/L SecureChat Preferred Acute Rehab [phone]   Alyvia Derk K Koonce 10/31/2022, 7:54 AM

## 2022-10-31 NOTE — ED Notes (Signed)
ED TO INPATIENT HANDOFF REPORT  ED Nurse Name and Phone #: Osvaldo Shipper RN [phone]  S Name/Age/Gender Kelly Dodson 88 y.o. female Room/Bed: 003C/003C  Code Status   Code Status: Full Code  Home/SNF/Other Home Patient oriented to: self, place, time, and situation Is this baseline? Yes   Triage Complete: Triage complete  Chief Complaint Near syncope [R55]  Triage Note Patient BIB EMS for evaluation of near syncopal episode.  Pt reports she was at a family birthday party and was sitting at a table.  Felt like "everything was going dark" and family states she "almost passed out."  No reports of headache or weakness.  Did report doing yard work and not drinking/eating a lot today prior to going to party.   Allergies No Known Allergies  Level of Care/Admitting Diagnosis ED Disposition     ED Disposition  Admit   Condition  --   Comment  Hospital Area: [facility] [[id number]]  Level of Care: Telemetry Medical [104]  May place patient in observation at [facility] or [facility] if equivalent level of care is available:: Yes  Covid Evaluation: Asymptomatic - no recent exposure (last 10 days) testing not required  Diagnosis: Near syncope [phone]  Admitting Physician: Darlin Drop [[id number]]  Attending Physician: Darlin Drop [[id number]]          B Medical/Surgery History Past Medical History:  Diagnosis Date   Hypercholesteremia    Hypertension    History reviewed. No pertinent surgical history.   A IV Location/Drains/Wounds Patient Lines/Drains/Airways Status     Active Line/Drains/Airways     Name Placement date Placement time Site Days   Peripheral IV 10/30/22 20 G Anterior;Proximal;Right Forearm 10/30/22  2049  Forearm  1            Intake/Output Last 24 hours No intake or output data in the 24 hours ending 10/31/22 0818  Labs/Imaging Results for orders placed or performed during the hospital encounter of 10/30/22 (from the past  48 hour(s))  Basic metabolic panel     Status: Abnormal   Collection Time: 10/30/22  8:51 PM  Result Value Ref Range   Sodium 142 135 - 145 mmol/L   Potassium 3.6 3.5 - 5.1 mmol/L   Chloride 106 98 - 111 mmol/L   CO2 22 22 - 32 mmol/L   Glucose, Bld 113 (H) 70 - 99 mg/dL    Comment: Glucose reference range applies only to samples taken after fasting for at least 8 hours.   BUN 21 8 - 23 mg/dL   Creatinine, Ser 0.86 (H) 0.44 - 1.00 mg/dL   Calcium 9.9 8.9 - 57.8 mg/dL   GFR, Estimated 39 (L) >60 mL/min    Comment: (NOTE) Calculated using the CKD-EPI Creatinine Equation (2021)    Anion gap 14 5 - 15    Comment: Performed at [facility], [address] [place], [place] [address]  CBC     Status: None   Collection Time: 10/30/22  8:51 PM  Result Value Ref Range   WBC 8.0 4.0 - 10.5 K/uL   RBC 4.66 3.87 - 5.11 MIL/uL   Hemoglobin 13.7 12.0 - 15.0 g/dL   HCT 95.2 84.1 - 32.4 %   MCV 91.2 80.0 - 100.0 fL   MCH 29.4 26.0 - 34.0 pg   MCHC 32.2 30.0 - 36.0 g/dL   RDW 40.1 02.7 - 25.3 %   Platelets 244 150 - 400 K/uL   nRBC 0.0  0.0 - 0.2 %    Comment: Performed at [facility], [address] [place], [place] [address]  CBG monitoring, ED     Status: Abnormal   Collection Time: 10/30/22  9:05 PM  Result Value Ref Range   Glucose-Capillary 105 (H) 70 - 99 mg/dL    Comment: Glucose reference range applies only to samples taken after fasting for at least 8 hours.  Urinalysis, Routine w reflex microscopic -Urine, Clean Catch     Status: Abnormal   Collection Time: 10/30/22 11:39 PM  Result Value Ref Range   Color, Urine YELLOW YELLOW   APPearance CLEAR CLEAR   Specific Gravity, Urine 1.021 1.005 - 1.030   pH 5.0 5.0 - 8.0   Glucose, UA NEGATIVE NEGATIVE mg/dL   Hgb urine dipstick NEGATIVE NEGATIVE   Bilirubin Urine NEGATIVE NEGATIVE   Ketones, ur NEGATIVE NEGATIVE mg/dL   Protein, ur NEGATIVE NEGATIVE mg/dL   Nitrite NEGATIVE NEGATIVE   Leukocytes,Ua SMALL (A)  NEGATIVE   RBC / HPF 0-5 0 - 5 RBC/hpf   WBC, UA 0-5 0 - 5 WBC/hpf   Bacteria, UA NONE SEEN NONE SEEN   Squamous Epithelial / HPF 0-5 0 - 5 /HPF    Comment: Performed at [facility], [address], [place], [place] [address]  Troponin I (High Sensitivity)     Status: None   Collection Time: 10/31/22  3:54 AM  Result Value Ref Range   Troponin I (High Sensitivity) 9 <18 ng/L    Comment: (NOTE) Elevated high sensitivity troponin I (hsTnI) values and significant  changes across serial measurements may suggest ACS but many other  chronic and acute conditions are known to elevate hsTnI results.  Refer to the "Links" section for chest pain algorithms and additional  guidance. Performed at [facility], [address] [place], [place] [address]   CBC     Status: None   Collection Time: 10/31/22  3:54 AM  Result Value Ref Range   WBC 4.8 4.0 - 10.5 K/uL   RBC 4.20 3.87 - 5.11 MIL/uL   Hemoglobin 12.4 12.0 - 15.0 g/dL   HCT 65.7 84.6 - 96.2 %   MCV 92.6 80.0 - 100.0 fL   MCH 29.5 26.0 - 34.0 pg   MCHC 31.9 30.0 - 36.0 g/dL   RDW 95.2 84.1 - 32.4 %   Platelets 197 150 - 400 K/uL   nRBC 0.0 0.0 - 0.2 %    Comment: Performed at [facility], [address], [place], [place] [address]  Creatinine, serum     Status: Abnormal   Collection Time: 10/31/22  3:54 AM  Result Value Ref Range   Creatinine, Ser 1.08 (H) 0.44 - 1.00 mg/dL   GFR, Estimated 50 (L) >60 mL/min    Comment: (NOTE) Calculated using the CKD-EPI Creatinine Equation (2021) Performed at [facility], [address], [place], [place] [address]    CT Head Wo Contrast  Result Date: 10/31/2022 CLINICAL DATA:  Syncope EXAM: CT HEAD WITHOUT CONTRAST TECHNIQUE: Contiguous axial images were obtained from the base of the skull through the vertex without intravenous contrast. RADIATION DOSE REDUCTION: This exam was performed according to the departmental dose-optimization program which includes automated  exposure control, adjustment of the mA and/or kV according to patient size and/or use of iterative reconstruction technique. COMPARISON:  05/07/2021 FINDINGS: Brain: There is no mass, hemorrhage or extra-axial collection. The size and configuration of the ventricles  and extra-axial CSF spaces are normal. There is hypoattenuation of the white matter, most commonly indicating chronic small vessel disease. 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 Vascular: No hyperdense vessel or unexpected vascular calcification. Skull: The visualized skull base, calvarium and extracranial soft tissues are normal. Sinuses/Orbits: No fluid levels or advanced mucosal thickening of the visualized paranasal sinuses. No mastoid or middle ear effusion. Normal orbits. IMPRESSION: 1. No acute intracranial abnormality. 2. 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 Electronically Signed   By: Deatra Robinson M.D.   On: 10/31/2022 00:57    Pending Labs Unresulted Labs (From admission, onward)     Start     Ordered   11/07/22 0500  Creatinine, serum  (enoxaparin (LOVENOX)    CrCl >/= 30 ml/min)  Weekly,   R     Comments: while on enoxaparin therapy    10/31/22 0217            Vitals/Pain Today's Vitals   10/31/22 0310 10/31/22 0354 10/31/22 0400 10/31/22 0705  BP: 111/63  127/65 122/60  Pulse: 65  73 64  Resp: (!) 30  (!) 25 (!) 22  Temp:  98.2 F (36.8 C)    TempSrc:  Oral    SpO2: 94%  95% 95%  Weight:      Height:      PainSc:        Isolation Precautions No active isolations  Medications Medications  enoxaparin (LOVENOX) injection 30 mg (has no administration in time range)  ezetimibe (ZETIA) tablet 10 mg (has no administration in time range)  acetaminophen (TYLENOL) tablet 650 mg (has no administration in time range)  prochlorperazine (COMPAZINE) injection 5 mg (has no administration in time range)  melatonin tablet 5 mg (has no administration in time range)  polyethylene  glycol (MIRALAX / GLYCOLAX) packet 17 g (has no administration in time range)  lactated ringers infusion ( Intravenous New Bag/Given 10/31/22 0355)    Mobility walks     Focused Assessments Cardiac Assessment Handoff:    No results found for: "CKTOTAL", "CKMB", "CKMBINDEX", "TROPONINI" No results found for: "DDIMER" Does the Patient currently have chest pain? No    R Recommendations: See Admitting Provider Note  Report given to:   Additional Notes:

## 2022-10-31 NOTE — ED Notes (Signed)
Patient ambulated to and from restroom with steady gait.  No reports of dizziness or lightheadedness

## 2022-10-31 NOTE — Progress Notes (Signed)
  Echocardiogram 2D Echocardiogram has been performed.  Kelly Dodson 10/31/2022, 4:13 PM

## 2022-10-31 NOTE — Progress Notes (Signed)
Brief note: -Patient was admitted earlier today.  As per H&P done on admission by Dr. Dow Adolph: "Kelly Dodson is a 88 y.o. female with medical history significant for nonrheumatic aortic valve stenosis, hypertension, who presents via EMS, from a birthday party, for evaluation of near syncope.  The patient was at a birthday party and suddenly felt hot and lightheaded.  She asked for a glass of cold water, felt like she was going to pass out.  Prior to going to the party, endorses doing some yard work with minimal oral intake today.  EMS was activated and she was brought to the ED for further evaluation.   In the ED, BPs are soft.  Lab work significant for elevated creatinine above baseline.  EDP requested admission for near syncope workup.  Admitted by [facility], hospitalist service.   ED Course: Temperature 98.2.  BP high into the 58, pulse 70, respiratory 23, saturation 92% on room air.  Lab studies notable for serum glucose 113, BUN 21, creatinine 1.32 with baseline creatinine 0.9 with GFR greater than 60".  Lab work revealed: Serum creatinine has improved from 1.32-1.08, and EGFR has improved from 39 mL/min per 1.73 m to 50 mL/min per 1.73 m.  Continue IV fluids.  Continue to monitor orthostatic blood pressures.  Renal panel in AM.    Overall, patient is slowly improving.  Follow the results of echocardiogram done earlier today.

## 2022-11-01 DIAGNOSIS — R55 Syncope and collapse: Secondary | ICD-10-CM | POA: Diagnosis not present

## 2022-11-01 LAB — RENAL FUNCTION PANEL
Albumin: 3.8 g/dL (ref 3.5–5.0)
Anion gap: 10 (ref 5–15)
BUN: 12 mg/dL (ref 8–23)
CO2: 22 mmol/L (ref 22–32)
Calcium: 9.3 mg/dL (ref 8.9–10.3)
Chloride: 109 mmol/L (ref 98–111)
Creatinine, Ser: 0.84 mg/dL (ref 0.44–1.00)
GFR, Estimated: >60 mL/min (ref >60)
Glucose, Bld: 84 mg/dL (ref 70–99)
Phosphorus: 3 mg/dL (ref 2.5–4.6)
Potassium: 4.2 mmol/L (ref 3.5–5.1)
Sodium: 141 mmol/L (ref 135–145)

## 2022-11-01 LAB — MAGNESIUM: Magnesium: 2.1 mg/dL (ref 1.7–2.4)

## 2022-11-01 MED ORDER — ROSUVASTATIN CALCIUM 5 MG PO TABS
5.0000 mg | ORAL_TABLET | ORAL | Status: DC
  Administered 2022-11-01: 5 mg via ORAL
  Filled 2022-11-01: qty 1

## 2022-11-01 MED ORDER — POTASSIUM CHLORIDE CRYS ER 20 MEQ PO TBCR
20.0000 meq | EXTENDED_RELEASE_TABLET | Freq: Once | ORAL | Status: AC
  Administered 2022-11-01: 20 meq via ORAL
  Filled 2022-11-01: qty 1

## 2022-11-01 MED ORDER — EZETIMIBE 10 MG PO TABS: 10.0000 mg | ORAL_TABLET | Freq: Every day | ORAL | Status: AC

## 2022-11-01 MED ORDER — METOPROLOL TARTRATE 5 MG/5ML IV SOLN: 5.0000 mg | INTRAVENOUS | Status: DC | PRN

## 2022-11-01 MED ORDER — SENNOSIDES-DOCUSATE SODIUM 8.6-50 MG PO TABS: 1.0000 | ORAL_TABLET | Freq: Every evening | ORAL | Status: DC | PRN

## 2022-11-01 MED ORDER — HYDRALAZINE HCL 20 MG/ML IJ SOLN
10.0000 mg | INTRAMUSCULAR | Status: DC | PRN
  Administered 2022-11-01: 10 mg via INTRAVENOUS
  Filled 2022-11-01: qty 1

## 2022-11-01 MED ORDER — NEBIVOLOL HCL 10 MG PO TABS
10.0000 mg | ORAL_TABLET | Freq: Every day | ORAL | Status: DC
  Administered 2022-11-01: 10 mg via ORAL
  Filled 2022-11-01: qty 1

## 2022-11-01 MED ORDER — TRAZODONE HCL 50 MG PO TABS: 50.0000 mg | ORAL_TABLET | Freq: Every evening | ORAL | Status: DC | PRN

## 2022-11-01 MED ORDER — IPRATROPIUM-ALBUTEROL 0.5-2.5 (3) MG/3ML IN SOLN: 3.0000 mL | RESPIRATORY_TRACT | Status: DC | PRN

## 2022-11-01 MED ORDER — ONDANSETRON HCL 4 MG/2ML IJ SOLN: 4.0000 mg | Freq: Four times a day (QID) | INTRAMUSCULAR | Status: DC | PRN

## 2022-11-01 MED ORDER — AMLODIPINE BESYLATE 10 MG PO TABS
10.0000 mg | ORAL_TABLET | Freq: Every day | ORAL | Status: DC
  Administered 2022-11-01: 10 mg via ORAL
  Filled 2022-11-01: qty 1

## 2022-11-01 NOTE — Hospital Course (Addendum)
  Brief Narrative:  88 year old with history of nonrheumatic aortic valve stenosis, hypertension comes to the ED with complaints of presyncope.  Upon admission patient was noted to have mild AKI and signs of dehydration.  During the hospitalization patient was hydrated which improved renal function.  Blood pressure was controlled and medications were adjusted as mentioned above.   Assessment & Plan:  Principal Problem:   Near syncope    Near syncope, possibly secondary to hypovolemia 2D echo shows preserved EF 65% with grade 1 DD. Encourage oral hydration. PT/OT.   AKI, suspect secondary to dehydration Baseline creatinine 0.9, admission 1.32.  Now resolved.   Essential hypertension Resume home Norvasc and losartan.  Hold HCTZ   Physical debility PT/OT-no follow-up necessary   DVT prophylaxis: Lovenox Code Status: Full code Family Communication: Daughter on the phone Status is: Inpatient Will discharge the patient today in stable condition

## 2022-11-01 NOTE — Discharge Summary (Signed)
Physician Discharge Summary  Kelly Dodson UUV:[id number] DOB: 1934-10-30 DOA: 10/30/2022  PCP: Renaye Rakers, MD  Admit date: 10/30/2022 Discharge date: 11/01/2022  Admitted From: Home Disposition: Home  Recommendations for Outpatient Follow-up:  Follow up with PCP in 1-2 weeks Please obtain BMP/CBC in one week your next doctors visit.  Discontinue hydrochlorothiazide.  Continue home Norvasc and losartan.   Discharge Condition: Stable CODE STATUS: Full code Diet recommendation: Heart healthy low-salt  Brief/Interim Summary:  Brief Narrative:  88 year old with history of nonrheumatic aortic valve stenosis, hypertension comes to the ED with complaints of presyncope.  Upon admission patient was noted to have mild AKI and signs of dehydration.  During the hospitalization patient was hydrated which improved renal function.  Blood pressure was controlled and medications were adjusted as mentioned above.   Assessment & Plan:  Principal Problem:   Near syncope    Near syncope, possibly secondary to hypovolemia 2D echo shows preserved EF 65% with grade 1 DD. Encourage oral hydration. PT/OT.   AKI, suspect secondary to dehydration Baseline creatinine 0.9, admission 1.32.  Now resolved.   Essential hypertension Resume home Norvasc and losartan.  Hold HCTZ   Physical debility PT/OT-no follow-up necessary   DVT prophylaxis: Lovenox Code Status: Full code Family Communication: Daughter on the phone Status is: Inpatient Will discharge the patient today in stable condition     Discharge Diagnoses:  Principal Problem:   Near syncope      Consultations: None  Subjective: Feels well no complaints.  Wishes to go home.  Tolerating oral without any issues  Discharge Exam: Vitals:   11/01/22 0949 11/01/22 1108  BP: (!) 180/85 134/80  Pulse: 89 90  Resp:    Temp:    SpO2: 99%    Vitals:   11/01/22 0945 11/01/22 0948 11/01/22 0949 11/01/22 1108  BP: (!) 188/85 (!)  180/95 (!) 180/85 134/80  Pulse: 86 92 89 90  Resp:      Temp:      TempSrc:      SpO2: 99% 98% 99%   Weight:      Height:        General: Pt is alert, awake, not in acute distress Cardiovascular: RRR, S1/S2 +, no rubs, no gallops Respiratory: CTA bilaterally, no wheezing, no rhonchi Abdominal: Soft, NT, ND, bowel sounds + Extremities: no edema, no cyanosis  Discharge Instructions   Allergies as of 11/01/2022       Reactions   Benazepril Other (See Comments)   Pt unsure Pt unsure  Pt unsure   Benzonatate Other (See Comments)   Other Reaction(s): Dizziness, Dizziness (intolerance), Unknown   Penicillin G Other (See Comments)   Other Reaction(s): Unknown, Unknown Pt unsure   Potassium Other (See Comments)   Other Reaction(s): Unknown, Unknown Pt unsure   Rosuvastatin Cough, Other (See Comments)   Other Reaction(s): Unknown, Unknown   Spironolactone Other (See Comments)   Syncopal episode related to recent use of medication Syncopal episode related to recent use of medication  Syncopal episode related to recent use of medication   Sulfamethoxazole-trimethoprim Other (See Comments)   Other Reaction(s): Unknown, Unknown Pt unsure        Medication List     STOP taking these medications    hydrochlorothiazide 12.5 MG tablet Commonly known as: HYDRODIURIL       TAKE these medications    cetirizine 10 MG tablet Commonly known as: ZYRTEC Take 10 mg by mouth daily.   ezetimibe 10 MG tablet Commonly known  as: Zetia Take 1 tablet (10 mg total) by mouth daily.   losartan 50 MG tablet Commonly known as: COZAAR Take 50 mg by mouth daily.   nebivolol 10 MG tablet Commonly known as: BYSTOLIC Take 10 mg by mouth daily.   Norvasc 10 MG tablet Generic drug: amLODipine Take 10 mg by mouth daily.   rosuvastatin 5 MG tablet Commonly known as: CRESTOR Take 5 mg by mouth once a week.   Vitamin D (Ergocalciferol) 1.25 MG (50000 UNIT) Caps capsule Commonly  known as: DRISDOL Take 50,000 Units by mouth once a week.        Allergies  Allergen Reactions   Benazepril Other (See Comments)    Pt unsure  Pt unsure  Pt unsure   Benzonatate Other (See Comments)    Other Reaction(s): Dizziness, Dizziness (intolerance), Unknown   Penicillin G Other (See Comments)    Other Reaction(s): Unknown, Unknown  Pt unsure   Potassium Other (See Comments)    Other Reaction(s): Unknown, Unknown  Pt unsure   Rosuvastatin Cough and Other (See Comments)    Other Reaction(s): Unknown, Unknown   Spironolactone Other (See Comments)    Syncopal episode related to recent use of medication  Syncopal episode related to recent use of medication  Syncopal episode related to recent use of medication   Sulfamethoxazole-Trimethoprim Other (See Comments)    Other Reaction(s): Unknown, Unknown  Pt unsure    You were cared for by a hospitalist during your hospital stay. If you have any questions about your discharge medications or the care you received while you were in the hospital after you are discharged, you can call the unit and asked to speak with the hospitalist on call if the hospitalist that took care of you is not available. Once you are discharged, your primary care physician will handle any further medical issues. Please note that no refills for any discharge medications will be authorized once you are discharged, as it is imperative that you return to your primary care physician (or establish a relationship with a primary care physician if you do not have one) for your aftercare needs so that they can reassess your need for medications and monitor your lab values.  You were cared for by a hospitalist during your hospital stay. If you have any questions about your discharge medications or the care you received while you were in the hospital after you are discharged, you can call the unit and asked to speak with the hospitalist on call if the hospitalist that  took care of you is not available. Once you are discharged, your primary care physician will handle any further medical issues. Please note that NO REFILLS for any discharge medications will be authorized once you are discharged, as it is imperative that you return to your primary care physician (or establish a relationship with a primary care physician if you do not have one) for your aftercare needs so that they can reassess your need for medications and monitor your lab values.  Please request your Prim.MD to go over all Hospital Tests and Procedure/Radiological results at the follow up, please get all Hospital records sent to your Prim MD by signing hospital release before you go home.  Get CBC, CMP, 2 view Chest X ray checked  by Primary MD during your next visit or SNF MD in 5-7 days ( we routinely change or add medications that can affect your baseline labs and fluid status, therefore we recommend that you get the  mentioned basic workup next visit with your PCP, your PCP may decide not to get them or add new tests based on their clinical decision)  On your next visit with your primary care physician please Get Medicines reviewed and adjusted.  If you experience worsening of your admission symptoms, develop shortness of breath, life threatening emergency, suicidal or homicidal thoughts you must seek medical attention immediately by calling 911 or calling your MD immediately  if symptoms less severe.  You Must read complete instructions/literature along with all the possible adverse reactions/side effects for all the Medicines you take and that have been prescribed to you. Take any new Medicines after you have completely understood and accpet all the possible adverse reactions/side effects.   Do not drive, operate heavy machinery, perform activities at heights, swimming or participation in water activities or provide baby sitting services if your were admitted for syncope or siezures until you have  seen by Primary MD or a Neurologist and advised to do so again.  Do not drive when taking Pain medications.   Procedures/Studies: ECHOCARDIOGRAM COMPLETE  Result Date: 10/31/2022    ECHOCARDIOGRAM REPORT   Patient Name:   Kelly Dodson Date of Exam: 10/31/2022 Medical Rec #:  [id number]     Height:       63.0 in Accession #:    [id number]    Weight:       140.0 lb Date of Birth:  1934/11/27      BSA:          1.662 m Patient Age:    88 years      BP:           138/62 mmHg Patient Gender: F             HR:           80 bpm. Exam Location:  Inpatient Procedure: 2D Echo, Color Doppler and Cardiac Doppler Indications:    postural dizziness with presyncope  History:        Patient has no prior history of Echocardiogram examinations.                 Risk Factors:Hypertension and Dyslipidemia.  Sonographer:    Delcie Roch RDCS Referring Phys: [id number] CAROLE N HALL IMPRESSIONS  1. Left ventricular ejection fraction, by estimation, is 60 to 65%. The left ventricle has normal function. The left ventricle has no regional wall motion abnormalities. There is mild concentric left ventricular hypertrophy. Left ventricular diastolic parameters are consistent with Grade I diastolic dysfunction (impaired relaxation).  2. Right ventricular systolic function is normal. The right ventricular size is normal. There is moderately elevated pulmonary artery systolic pressure.  3. The mitral valve is normal in structure. Trivial mitral valve regurgitation. No evidence of mitral stenosis.  4. The aortic valve is calcified. Aortic valve regurgitation is not visualized. Moderate aortic valve stenosis. Aortic valve area, by VTI measures 1.06 cm. Aortic valve mean gradient measures 24.0 mmHg. Aortic valve Vmax measures 3.08 m/s.  5. The inferior vena cava is normal in size with greater than 50% respiratory variability, suggesting right atrial pressure of 3 mmHg. FINDINGS  Left Ventricle: Left ventricular ejection fraction, by  estimation, is 60 to 65%. The left ventricle has normal function. The left ventricle has no regional wall motion abnormalities. The left ventricular internal cavity size was normal in size. There is  mild concentric left ventricular hypertrophy. Left ventricular diastolic parameters are consistent with Grade I diastolic dysfunction (impaired relaxation). Right  Ventricle: The right ventricular size is normal. No increase in right ventricular wall thickness. Right ventricular systolic function is normal. There is moderately elevated pulmonary artery systolic pressure. The tricuspid regurgitant velocity is 3.49 m/s, and with an assumed right atrial pressure of 3 mmHg, the estimated right ventricular systolic pressure is 51.7 mmHg. Left Atrium: Left atrial size was normal in size. Right Atrium: Right atrial size was normal in size. Pericardium: There is no evidence of pericardial effusion. Mitral Valve: The mitral valve is normal in structure. Trivial mitral valve regurgitation. No evidence of mitral valve stenosis. Tricuspid Valve: The tricuspid valve is normal in structure. Tricuspid valve regurgitation is trivial. No evidence of tricuspid stenosis. Aortic Valve: The aortic valve is calcified. Aortic valve regurgitation is not visualized. Moderate aortic stenosis is present. Aortic valve mean gradient measures 24.0 mmHg. Aortic valve peak gradient measures 37.9 mmHg. Aortic valve area, by VTI measures 1.06 cm. Pulmonic Valve: The pulmonic valve was not well visualized. Pulmonic valve regurgitation is trivial. No evidence of pulmonic stenosis. Aorta: The aortic root is normal in size and structure. Venous: The inferior vena cava is normal in size with greater than 50% respiratory variability, suggesting right atrial pressure of 3 mmHg. IAS/Shunts: No atrial level shunt detected by color flow Doppler.  LEFT VENTRICLE PLAX 2D LVIDd:         4.20 cm   Diastology LVIDs:         2.80 cm   LV e' medial:    8.16 cm/s LV PW:          1.10 cm   LV E/e' medial:  9.9 LV IVS:        1.03 cm   LV e' lateral:   9.68 cm/s LVOT diam:     2.00 cm   LV E/e' lateral: 8.3 LV SV:         73 LV SV Index:   44 LVOT Area:     3.14 cm  RIGHT VENTRICLE             IVC RV Basal diam:  2.40 cm     IVC diam: 1.50 cm RV S prime:     12.20 cm/s TAPSE (M-mode): 2.8 cm LEFT ATRIUM             Index        RIGHT ATRIUM           Index LA diam:        3.30 cm 1.99 cm/m   RA Area:     12.00 cm LA Vol (A2C):   35.8 ml 21.54 ml/m  RA Volume:   24.80 ml  14.92 ml/m LA Vol (A4C):   53.1 ml 31.96 ml/m LA Biplane Vol: 44.6 ml 26.84 ml/m  AORTIC VALVE                     PULMONIC VALVE AV Area (Vmax):    1.00 cm      PR End Diast Vel: 8.88 msec AV Area (Vmean):   0.91 cm AV Area (VTI):     1.06 cm AV Vmax:           308.00 cm/s AV Vmean:          235.000 cm/s AV VTI:            0.690 m AV Peak Grad:      37.9 mmHg AV Mean Grad:      24.0 mmHg LVOT Vmax:  98.20 cm/s LVOT Vmean:        68.350 cm/s LVOT VTI:          0.232 m LVOT/AV VTI ratio: 0.34  AORTA Ao Root diam: 3.10 cm MITRAL VALVE                TRICUSPID VALVE MV Area (PHT): 3.48 cm     TR Peak grad:   48.7 mmHg MV Decel Time: 218 msec     TR Vmax:        349.00 cm/s MV E velocity: 80.50 cm/s MV A velocity: 110.00 cm/s  SHUNTS MV E/A ratio:  0.73         Systemic VTI:  0.23 m                             Systemic Diam: 2.00 cm Kardie Tobb DO Electronically signed by Thomasene Ripple DO Signature Date/Time: 10/31/2022/4:20:16 PM    Final    CT Head Wo Contrast  Result Date: 10/31/2022 CLINICAL DATA:  Syncope EXAM: CT HEAD WITHOUT CONTRAST TECHNIQUE: Contiguous axial images were obtained from the base of the skull through the vertex without intravenous contrast. RADIATION DOSE REDUCTION: This exam was performed according to the departmental dose-optimization program which includes automated exposure control, adjustment of the mA and/or kV according to patient size and/or use of iterative reconstruction  technique. COMPARISON:  05/07/2021 FINDINGS: Brain: There is no mass, hemorrhage or extra-axial collection. The size and configuration of the ventricles and extra-axial CSF spaces are normal. There is hypoattenuation of the white matter, most commonly indicating chronic small vessel disease. 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 Vascular: No hyperdense vessel or unexpected vascular calcification. Skull: The visualized skull base, calvarium and extracranial soft tissues are normal. Sinuses/Orbits: No fluid levels or advanced mucosal thickening of the visualized paranasal sinuses. No mastoid or middle ear effusion. Normal orbits. IMPRESSION: 1. No acute intracranial abnormality. 2. 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 Electronically Signed   By: Deatra Robinson M.D.   On: 10/31/2022 00:57     The results of significant diagnostics from this hospitalization (including imaging, microbiology, ancillary and laboratory) are listed below for reference.     Microbiology: No results found for this or any previous visit (from the past 240 hour(s)).   Labs: BNP (last 3 results) No results for input(s): "BNP" in the last 8760 hours. Basic Metabolic Panel: Recent Labs  Lab 10/30/22 2051 10/31/22 0354 10/31/22 1909 11/01/22 0903  NA 142  --  139 141  K 3.6  --  3.5 4.2  CL 106  --  104 109  CO2 22  --  22 22  GLUCOSE 113*  --  133* 84  BUN 21  --  19 12  CREATININE 1.32* 1.08* 1.21* 0.84  CALCIUM 9.9  --  9.2 9.3  MG  --   --  2.0 2.1  PHOS  --   --  3.1 3.0   Liver Function Tests: Recent Labs  Lab 10/31/22 1909 11/01/22 0903  ALBUMIN 3.3* 3.8   No results for input(s): "LIPASE", "AMYLASE" in the last 168 hours. No results for input(s): "AMMONIA" in the last 168 hours. CBC: Recent Labs  Lab 10/30/22 2051 10/31/22 0354  WBC 8.0 4.8  HGB 13.7 12.4  HCT 42.5 38.9  MCV 91.2 92.6  PLT 244 197   Cardiac Enzymes: No results for input(s): "CKTOTAL",  "CKMB", "CKMBINDEX", "  TROPONINI" in the last 168 hours. BNP: Invalid input(s): "POCBNP" CBG: Recent Labs  Lab 10/30/22 2105  GLUCAP 105*   D-Dimer No results for input(s): "DDIMER" in the last 72 hours. Hgb A1c No results for input(s): "HGBA1C" in the last 72 hours. Lipid Profile No results for input(s): "CHOL", "HDL", "LDLCALC", "TRIG", "CHOLHDL", "LDLDIRECT" in the last 72 hours. Thyroid function studies No results for input(s): "TSH", "T4TOTAL", "T3FREE", "THYROIDAB" in the last 72 hours.  Invalid input(s): "FREET3" Anemia work up No results for input(s): "VITAMINB12", "FOLATE", "FERRITIN", "TIBC", "IRON", "RETICCTPCT" in the last 72 hours. Urinalysis    Component Value Date/Time   COLORURINE YELLOW 10/30/2022 2339   APPEARANCEUR CLEAR 10/30/2022 2339   LABSPEC 1.021 10/30/2022 2339   PHURINE 5.0 10/30/2022 2339   GLUCOSEU NEGATIVE 10/30/2022 2339   HGBUR NEGATIVE 10/30/2022 2339   BILIRUBINUR NEGATIVE 10/30/2022 2339   KETONESUR NEGATIVE 10/30/2022 2339   PROTEINUR NEGATIVE 10/30/2022 2339   NITRITE NEGATIVE 10/30/2022 2339   LEUKOCYTESUR SMALL (A) 10/30/2022 2339   Sepsis Labs Recent Labs  Lab 10/30/22 2051 10/31/22 0354  WBC 8.0 4.8   Microbiology No results found for this or any previous visit (from the past 240 hour(s)).   Time coordinating discharge:  I have spent 35 minutes face to face with the patient and on the ward discussing the patients care, assessment, plan and disposition with other care givers. >50% of the time was devoted counseling the patient about the risks and benefits of treatment/Discharge disposition and coordinating care.   SIGNED:   Miguel Rota, MD  [facility] 11/01/2022, 12:37 PM   If 7PM-7AM, please contact night-coverage

## 2022-11-03 DIAGNOSIS — E860 Dehydration: Secondary | ICD-10-CM | POA: Diagnosis not present

## 2022-11-03 DIAGNOSIS — I10 Essential (primary) hypertension: Secondary | ICD-10-CM | POA: Diagnosis not present

## 2022-11-03 DIAGNOSIS — E782 Mixed hyperlipidemia: Secondary | ICD-10-CM | POA: Diagnosis not present

## 2022-11-03 DIAGNOSIS — E785 Hyperlipidemia, unspecified: Secondary | ICD-10-CM | POA: Diagnosis not present

## 2022-11-25 DIAGNOSIS — E1169 Type 2 diabetes mellitus with other specified complication: Secondary | ICD-10-CM | POA: Diagnosis not present

## 2022-11-25 DIAGNOSIS — I10 Essential (primary) hypertension: Secondary | ICD-10-CM | POA: Diagnosis not present

## 2022-12-09 DIAGNOSIS — N8110 Cystocele, unspecified: Secondary | ICD-10-CM | POA: Diagnosis not present

## 2022-12-17 DIAGNOSIS — I10 Essential (primary) hypertension: Secondary | ICD-10-CM | POA: Diagnosis not present

## 2022-12-17 DIAGNOSIS — N390 Urinary tract infection, site not specified: Secondary | ICD-10-CM | POA: Diagnosis not present

## 2022-12-17 DIAGNOSIS — E1169 Type 2 diabetes mellitus with other specified complication: Secondary | ICD-10-CM | POA: Diagnosis not present

## 2022-12-17 DIAGNOSIS — E785 Hyperlipidemia, unspecified: Secondary | ICD-10-CM | POA: Diagnosis not present

## 2022-12-30 DIAGNOSIS — Z1231 Encounter for screening mammogram for malignant neoplasm of breast: Secondary | ICD-10-CM | POA: Diagnosis not present

## 2023-01-07 DIAGNOSIS — N390 Urinary tract infection, site not specified: Secondary | ICD-10-CM | POA: Diagnosis not present

## 2023-01-07 DIAGNOSIS — I10 Essential (primary) hypertension: Secondary | ICD-10-CM | POA: Diagnosis not present

## 2023-01-07 DIAGNOSIS — E785 Hyperlipidemia, unspecified: Secondary | ICD-10-CM | POA: Diagnosis not present

## 2023-01-27 DIAGNOSIS — I10 Essential (primary) hypertension: Secondary | ICD-10-CM | POA: Diagnosis not present

## 2023-01-27 DIAGNOSIS — E1169 Type 2 diabetes mellitus with other specified complication: Secondary | ICD-10-CM | POA: Diagnosis not present

## 2023-01-27 DIAGNOSIS — T887XXD Unspecified adverse effect of drug or medicament, subsequent encounter: Secondary | ICD-10-CM | POA: Diagnosis not present

## 2023-01-27 DIAGNOSIS — N3281 Overactive bladder: Secondary | ICD-10-CM | POA: Diagnosis not present

## 2023-01-27 DIAGNOSIS — R410 Disorientation, unspecified: Secondary | ICD-10-CM | POA: Diagnosis not present

## 2023-02-03 DIAGNOSIS — H40023 Open angle with borderline findings, high risk, bilateral: Secondary | ICD-10-CM | POA: Diagnosis not present

## 2023-02-03 DIAGNOSIS — H04123 Dry eye syndrome of bilateral lacrimal glands: Secondary | ICD-10-CM | POA: Diagnosis not present

## 2023-02-09 DIAGNOSIS — H903 Sensorineural hearing loss, bilateral: Secondary | ICD-10-CM | POA: Diagnosis not present

## 2023-02-18 DIAGNOSIS — R410 Disorientation, unspecified: Secondary | ICD-10-CM | POA: Diagnosis not present

## 2023-02-18 DIAGNOSIS — E782 Mixed hyperlipidemia: Secondary | ICD-10-CM | POA: Diagnosis not present

## 2023-02-18 DIAGNOSIS — I10 Essential (primary) hypertension: Secondary | ICD-10-CM | POA: Diagnosis not present

## 2023-03-09 DIAGNOSIS — I371 Nonrheumatic pulmonary valve insufficiency: Secondary | ICD-10-CM | POA: Diagnosis not present

## 2023-03-09 DIAGNOSIS — I361 Nonrheumatic tricuspid (valve) insufficiency: Secondary | ICD-10-CM | POA: Diagnosis not present

## 2023-03-09 DIAGNOSIS — I340 Nonrheumatic mitral (valve) insufficiency: Secondary | ICD-10-CM | POA: Diagnosis not present

## 2023-03-24 DIAGNOSIS — E782 Mixed hyperlipidemia: Secondary | ICD-10-CM | POA: Diagnosis not present

## 2023-03-24 DIAGNOSIS — I10 Essential (primary) hypertension: Secondary | ICD-10-CM | POA: Diagnosis not present

## 2023-03-24 DIAGNOSIS — R7309 Other abnormal glucose: Secondary | ICD-10-CM | POA: Diagnosis not present

## 2023-04-15 DIAGNOSIS — Z4689 Encounter for fitting and adjustment of other specified devices: Secondary | ICD-10-CM | POA: Diagnosis not present

## 2023-05-05 DIAGNOSIS — I10 Essential (primary) hypertension: Secondary | ICD-10-CM | POA: Diagnosis not present

## 2023-05-05 DIAGNOSIS — E785 Hyperlipidemia, unspecified: Secondary | ICD-10-CM | POA: Diagnosis not present

## 2023-06-24 DIAGNOSIS — I158 Other secondary hypertension: Secondary | ICD-10-CM | POA: Diagnosis not present

## 2023-06-24 DIAGNOSIS — I350 Nonrheumatic aortic (valve) stenosis: Secondary | ICD-10-CM | POA: Diagnosis not present

## 2023-06-24 DIAGNOSIS — I517 Cardiomegaly: Secondary | ICD-10-CM | POA: Diagnosis not present

## 2023-08-04 DIAGNOSIS — H40023 Open angle with borderline findings, high risk, bilateral: Secondary | ICD-10-CM | POA: Diagnosis not present

## 2023-08-04 DIAGNOSIS — H524 Presbyopia: Secondary | ICD-10-CM | POA: Diagnosis not present

## 2023-08-04 DIAGNOSIS — H52203 Unspecified astigmatism, bilateral: Secondary | ICD-10-CM | POA: Diagnosis not present

## 2023-08-04 DIAGNOSIS — H43393 Other vitreous opacities, bilateral: Secondary | ICD-10-CM | POA: Diagnosis not present

## 2023-08-04 DIAGNOSIS — H04123 Dry eye syndrome of bilateral lacrimal glands: Secondary | ICD-10-CM | POA: Diagnosis not present

## 2023-08-04 NOTE — Progress Notes (Signed)
 08/04/23   [facility] Ophthalmology - [place] Visit Note     CHIEF COMPLAINT Patient presents for Eye Exam   HISTORY OF PRESENT ILLNESS: Kelly Dodson is a 89 y.o. female who presents to the clinic today for:   HPI     Eye Exam   I, the attending physician, performed the HPI with the patient and updated the documentation appropriately.  The patient is here for a return visit.        Comments   LV: 02/03/2023  Patient returns as scheduled for Routine Eye Exam   Patient states vision is stable and functions well with current glasses.  Comfort of eyes is good. Denies eye pain, redness, dryness, tearing or photophobia. Denies flashes, floaters or dark curtain/veil.   Uses artifical tears 2 times daily/prn both eyes.        Last edited by Lynwood Arthea Plover, MD on 08/04/2023 10:58 AM.      CURRENT MEDICATIONS: Medications Ordered Prior to Encounter[1]  Referring physician: No referring provider defined for this encounter.  ALLERGIES Allergies[2]  PAST MEDICAL HISTORY Medical History[3] Surgical History[4]  FAMILY HISTORY Family History[5]  SOCIAL HISTORY Social History[6]      OPHTHALMIC EXAM:  Base Eye Exam     Visual Acuity (Snellen - Linear)       Right Left   Dist cc 20/30 +2 20/20   Near cc J1 J1    Correction: Glasses         Tonometry (JZF: Applanation: Fluress OU, 10:53 AM)       Right Left   Pressure 15 16  MD to check IOP per protocol         Pupils       Pupils Dark React APD   Right PERRL 1.5 Minimal None   Left PERRL 1.5 Minimal None         Visual Fields       Left Right    Full Full         Extraocular Movement       Right Left    Full Full         Neuro/Psych     Oriented x3: Yes   Mood/Affect: Normal         Dilation     Both eyes: 2.5% Phenylephrine, 1.0% Tropicamide @ 10:36 AM           Slit Lamp and Fundus Exam     External Exam       Right Left   External Normal Normal          Slit Lamp Exam       Right Left   Lids/Lashes Normal Normal   Conjunctiva/Sclera White and quiet White and quiet   Cornea 1+ SPK, horizontal endothelial striae 1+ SPK   Anterior Chamber Deep and quiet Deep and quiet   Iris Round and Dilated Round and Dilated   Lens PCIOL PC IOL         Fundus Exam       Right Left   Posterior Vitreous Vitreous floaters Vitreous floaters   Disc Normal Normal   C/D Ratio 0.6 0.6   Macula Normal Normal   Vessels Normal Normal   Periphery Normal Normal           Refraction     Wearing Rx       Sphere Cylinder Axis Add   Right +1.75 -6.25 122 +2.75   Left -1.00 -1.75 065 +2.75  Type: PAL         Manifest Refraction       Sphere Cylinder Axis Dist VA Add Near [place]   Right +1.75 -5.25 122 20/20 +2.75 J1+   Left -1.00 -1.25 065 20/20 +2.75 J1+         Manifest Refraction #2 (Auto)       Sphere Cylinder Axis Dist VA Add Near [place]   Right +1.75 -4.50 118      Left -0.75 -1.25 057            Final Rx       Sphere Cylinder Axis Dist VA Add Near [place]   Right +1.75 -5.25 122 20/20 +2.75 J1+   Left -1.00 -1.25 065 20/20 +2.75 J1+    Expiration Date: 08/03/2025  Remark: UV filter, Polycarbonate, Tint of choice Doctor Recommendations:Anti-Reflective              IMAGING AND PROCEDURES:   ASSESSMENT/PLAN:  1. Astigmatism of both eyes with presbyopia      2. 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 Dry eye syndrome of both eyes      4. Vitreous floaters of both eyes        Refractive error OU: New glasses RX given to patient today.   POAG suspect OU: IOP stable, increased C/D, slightly thicker pachy OD>OS,  continue to monitor without drops, F/U in 6 mos VF 24-2, IOP ck, NFL   Dry Eye Syndrome OU: use Artifical Tears TID-QID and Gel at bed time, daily.   Floaters OU: no retinal tears or retinal detachment. Retinal Detachment precautions discussed with  patient.   Medication ordered this visit:  No orders of the defined types were placed in this encounter.   Return in about 6 months (around 02/04/2024) for VF 24-2, OCT NFL, IOP CK, BY JZF, NO DIL.  Patient Instructions  For dry eyes, please use artificial tears 3-4 times daily and gel at bedtime. Please be advised that gel/gel drops may cause blurred vision.   Keep scheduled appointment, call sooner if needed.    Explained the diagnoses, plan, and follow up with the patient and they expressed understanding.  Patient expressed understanding of the importance of proper follow up care.    Abbreviations: M myopia (nearsighted); A astigmatism; H hyperopia (farsighted); P presbyopia; Mrx spectacle prescription;  CTL contact lenses; OD right eye; OS left eye; OU both eyes  XT exotropia; ET esotropia; PEK punctate epithelial keratitis; PEE punctate epithelial erosions; DES dry eye syndrome; MGD meibomian gland dysfunction; ATs artificial tears; PFAT's preservative free artificial tears; NSC nuclear sclerotic cataract; PSC posterior subcapsular cataract; ERM epi-retinal membrane; PVD posterior vitreous detachment; RD retinal detachment; DM diabetes mellitus; DR diabetic retinopathy; NPDR non-proliferative diabetic retinopathy; PDR proliferative diabetic retinopathy; CSME clinically significant macular edema; DME diabetic macular edema; dbh dot blot hemorrhages; CWS cotton wool spot; POAG primary open angle glaucoma; C/D cup-to-disc ratio; HVF humphrey visual field; GVF goldmann visual field; OCT optical coherence tomography; IOP intraocular pressure; BRVO Branch retinal vein occlusion; CRVO central retinal vein occlusion; CRAO central retinal artery occlusion; BRAO branch retinal artery occlusion; RT retinal tear; SB scleral buckle; PPV pars plana vitrectomy; VH Vitreous hemorrhage; PRP panretinal laser photocoagulation; IVK intravitreal kenalog; VMT vitreomacular traction; MH Macular hole;  NVD  neovascularization of the disc; NVE neovascularization elsewhere; AREDS age related eye disease study; ARMD age related macular degeneration; POAG primary open angle glaucoma; EBMD  epithelial/anterior basement membrane dystrophy; ACIOL anterior chamber intraocular lens; IOL intraocular lens; PCIOL posterior chamber intraocular lens; Phaco/IOL phacoemulsification with intraocular lens placement; PRK photorefractive keratectomy; LASIK laser assisted in situ keratomileusis; HTN hypertension; DM diabetes mellitus; COPD chronic obstructive pulmonary disease  This document serves as a record of services personally performed by Lynwood Arthea Plover, MD .  It was created on their behalf by Olam Slade Prevatte, COT, a trained medical scribe, and Certified Ophthalmic Tech (COT). During the course of documenting the history, physical exam and medical decision making, I was functioning as a Stage manager. The creation of this record is the provider's dictation and/or activities during the visit.  Electronically signed by Olam Slade Prevatte, COT 08/04/2023 10:40 AM  Electronically signed by: Lynwood Arthea Plover, MD 08/04/2023 10:59 AM        [1] Current Outpatient Medications on File Prior to Visit  Medication Sig Dispense Refill  . amLODIPine  (NORVASC ) 5 mg tablet Take 5 mg by mouth nightly.    . cetirizine (ZyrTEC) 10 mg tablet TAKE 1 TABLET BY MOUTH EVERY DAY FOR SEASONAL ALLERGIES    . cholecalciferol (VITAMIN D3) 1,250 mcg (50,000 unit) capsule Take 1 capsule by mouth once a week.    . estradioL (ESTRACE) 0.01 % (0.1 mg/gram) vaginal cream Place a pea-sized amount in the vagina nightly X 2 weeks, then use every other night 42 g 5  . indapamide (LOZOL) 1.25 mg tablet Take 1 tablet by mouth daily.    [name] losartan (COZAAR) 50 mg tablet TAKE 1 TABLET(50 MG) BY MOUTH DAILY 60 tablet 5  . nebivoloL  (BYSTOLIC ) 10 mg tablet TAKE 1 TABLET(10 MG) BY MOUTH DAILY 30 tablet 2  . rosuvastatin  (CRESTOR ) 10  mg tablet Take 10 mg by mouth daily. FOR CHOLESTEROL     No current facility-administered medications on file prior to visit.  [2] Allergies Allergen Reactions  . Benzonatate Dizziness and Other (See Comments)  . Benazepril Other (See Comments)    Pt unsure  . Penicillin Other (See Comments)    Pt unsure  . Potassium Other (See Comments)    Pt unsure  . Rosuvastatin  Cough and Other (See Comments)  . Spironolactone Other (See Comments)    Syncopal episode related to recent use of medication  . Sulfamethoxazole-Trimethoprim Other (See Comments)    Pt unsure  [3] Past Medical History: Diagnosis Date  . Arthritis   . Glaucoma suspect of both eyes   . High cholesterol   . Hypertension   . Iritis, chronic    Right eye   . Overactive bladder 06/25/2021  [4] Past Surgical History: Procedure Laterality Date  . CATARACT EXTRACTION Right 06/26/2015   Procedure: CATARACT EXTRACTION; Dr. Robert Davanzo  . CATARACT EXTRACTION Left 08/24/2021   Dr Gifford  . HYSTERECTOMY      Procedure: HYSTERECTOMY; age 37  [5] Family History Problem Relation Name Age of Onset  . Diabetes Mother    . Glaucoma Mother    . Cataracts Mother    . Cataracts Father    . Cataracts Maternal Grandmother    . Cataracts Maternal Grandfather    . Cataracts Paternal Grandmother    . Cataracts Paternal Grandfather    . Macular degeneration Neg Hx    . Blindness Neg Hx    [6] Social History Tobacco Use  . Smoking status: Never    Passive exposure: Never  . Smokeless tobacco: Never  Substance Use Topics  . Alcohol use: Never  . Drug use: Never

## 2023-08-11 DIAGNOSIS — E782 Mixed hyperlipidemia: Secondary | ICD-10-CM | POA: Diagnosis not present

## 2023-08-11 DIAGNOSIS — R7309 Other abnormal glucose: Secondary | ICD-10-CM | POA: Diagnosis not present

## 2023-08-11 DIAGNOSIS — R634 Abnormal weight loss: Secondary | ICD-10-CM | POA: Diagnosis not present

## 2023-08-11 DIAGNOSIS — I10 Essential (primary) hypertension: Secondary | ICD-10-CM | POA: Diagnosis not present

## 2023-08-11 DIAGNOSIS — Z6824 Body mass index (BMI) 24.0-24.9, adult: Secondary | ICD-10-CM | POA: Diagnosis not present

## 2023-08-18 DIAGNOSIS — I10 Essential (primary) hypertension: Secondary | ICD-10-CM | POA: Diagnosis not present

## 2023-08-23 DIAGNOSIS — Z4689 Encounter for fitting and adjustment of other specified devices: Secondary | ICD-10-CM | POA: Diagnosis not present

## 2023-09-13 DIAGNOSIS — Z961 Presence of intraocular lens: Secondary | ICD-10-CM | POA: Diagnosis not present

## 2023-09-13 DIAGNOSIS — H35371 Puckering of macula, right eye: Secondary | ICD-10-CM | POA: Diagnosis not present

## 2023-09-13 DIAGNOSIS — H04223 Epiphora due to insufficient drainage, bilateral lacrimal glands: Secondary | ICD-10-CM | POA: Diagnosis not present

## 2023-10-01 ENCOUNTER — Other Ambulatory Visit: Payer: Self-pay

## 2023-10-01 ENCOUNTER — Encounter (HOSPITAL_BASED_OUTPATIENT_CLINIC_OR_DEPARTMENT_OTHER): Payer: Self-pay | Admitting: Emergency Medicine

## 2023-10-01 ENCOUNTER — Emergency Department (HOSPITAL_BASED_OUTPATIENT_CLINIC_OR_DEPARTMENT_OTHER)

## 2023-10-01 ENCOUNTER — Emergency Department (HOSPITAL_BASED_OUTPATIENT_CLINIC_OR_DEPARTMENT_OTHER): Admission: EM | Admit: 2023-10-01 | Discharge: 2023-10-01 | Disposition: A | Discharge status: Home / Self Care

## 2023-10-01 DIAGNOSIS — R600 Localized edema: Secondary | ICD-10-CM | POA: Diagnosis not present

## 2023-10-01 DIAGNOSIS — E876 Hypokalemia: Secondary | ICD-10-CM | POA: Diagnosis not present

## 2023-10-01 DIAGNOSIS — M79604 Pain in right leg: Secondary | ICD-10-CM

## 2023-10-01 DIAGNOSIS — I10 Essential (primary) hypertension: Secondary | ICD-10-CM | POA: Insufficient documentation

## 2023-10-01 DIAGNOSIS — M79661 Pain in right lower leg: Secondary | ICD-10-CM | POA: Insufficient documentation

## 2023-10-01 LAB — BASIC METABOLIC PANEL WITH GFR
Anion gap: 13 (ref 5–15)
BUN: 17 mg/dL (ref 8–23)
CO2: 27 mmol/L (ref 22–32)
Calcium: 9.6 mg/dL (ref 8.9–10.3)
Chloride: 101 mmol/L (ref 98–111)
Creatinine, Ser: 1.02 mg/dL — ABNORMAL HIGH (ref 0.44–1.00)
GFR, Estimated: 53 mL/min — ABNORMAL LOW (ref >60)
Glucose, Bld: 134 mg/dL — ABNORMAL HIGH (ref 70–99)
Potassium: 3 mmol/L — ABNORMAL LOW (ref 3.5–5.1)
Sodium: 140 mmol/L (ref 135–145)

## 2023-10-01 LAB — D-DIMER, QUANTITATIVE: D-Dimer, Quant: 0.45 ug{FEU}/mL (ref 0.00–0.50)

## 2023-10-01 LAB — CBC
HCT: 38.6 % (ref 36.0–46.0)
Hemoglobin: 12.7 g/dL (ref 12.0–15.0)
MCH: 28.9 pg (ref 26.0–34.0)
MCHC: 32.9 g/dL (ref 30.0–36.0)
MCV: 87.7 fL (ref 80.0–100.0)
Platelets: 265 K/uL (ref 150–400)
RBC: 4.4 MIL/uL (ref 3.87–5.11)
RDW: 14.6 % (ref 11.5–15.5)
WBC: 5.6 K/uL (ref 4.0–10.5)
nRBC: 0 % (ref 0.0–0.2)

## 2023-10-01 MED ORDER — ACETAMINOPHEN 325 MG PO TABS
650.0000 mg | ORAL_TABLET | Freq: Once | ORAL | Status: AC
  Administered 2023-10-01: 650 mg via ORAL
  Filled 2023-10-01: qty 2

## 2023-10-01 MED ORDER — POTASSIUM CHLORIDE CRYS ER 20 MEQ PO TBCR
40.0000 meq | EXTENDED_RELEASE_TABLET | Freq: Once | ORAL | Status: AC
  Administered 2023-10-01: 40 meq via ORAL
  Filled 2023-10-01: qty 2

## 2023-10-01 NOTE — Discharge Instructions (Addendum)
 Your x-rays showed no evidence for fracture or other abnormality.  You may take over-the-counter medication such as Tylenol  for pain.  Please follow-up with your primary doctor regarding your pain and low potassium.  Return if you develop worsening pain, leg becomes blue, cold, pale or you develop chest pain, shortness of breath, lightheadedness, palpitations or any new or worsening symptoms that are concerning to you.

## 2023-10-01 NOTE — ED Provider Notes (Signed)
 [place] [facility] Provider Note   CSN: [id number] Arrival date & time: 10/01/23  2143     Patient presents with: Leg Pain   Kelly Dodson Kelly a 89 y.o. Dodson.  {Add pertinent medical, surgical, social history, OB history to HPI:32952} Kelly an 89 year old Dodson presenting emergency department with right lower leg pain.  Reports symptoms started today.  No reported trauma.  Pain behind knee and calf.  Worse with ambulation.  No numbness tingling changes in sensation.  Reports history of blood clot, not on anticoagulation currently.  She drove to [place] and back was in car close to 4 hours today.  No chest pain, no shortness of breath   Leg Pain      Prior to Admission medications   Medication Sig Start Date End Date Taking? Authorizing Provider  amLODipine  (NORVASC ) 10 MG tablet Take 10 mg by mouth daily. 06/25/04   [provider]  cetirizine (ZYRTEC) 10 MG tablet Take 10 mg by mouth daily. 05/31/22   [provider]  ezetimibe  (ZETIA ) 10 MG tablet Take 1 tablet (10 mg total) by mouth daily. 11/01/22   Amin, Ankit C, MD  losartan (COZAAR) 50 MG tablet Take 50 mg by mouth daily.    [provider]  nebivolol  (BYSTOLIC ) 10 MG tablet Take 10 mg by mouth daily. 09/02/20   [provider]  rosuvastatin  (CRESTOR ) 5 MG tablet Take 5 mg by mouth once a week.    [provider]  Vitamin D, Ergocalciferol, (DRISDOL) 1.25 MG (50000 UNIT) CAPS capsule Take 50,000 Units by mouth once a week. 10/13/22   [provider]    Allergies: Benazepril, Benzonatate, Penicillin g, Potassium, Rosuvastatin , Spironolactone, and Sulfamethoxazole-trimethoprim    Review of Systems  Updated Vital Signs BP (!) 145/65 (BP Location: Right Arm)   Pulse 86   Temp 98 F (36.7 C) (Oral)   Resp 20   Ht 5' 3 (1.6 m)   Wt 63.5 kg   SpO2 96%   BMI 24.80 kg/m   Physical Exam Vitals and nursing note reviewed.   Constitutional:      General: [name] not in acute distress.    Appearance: [name] not toxic-appearing.  HENT:     Head: Normocephalic.     Nose: Nose normal.     Mouth/Throat:     Mouth: Mucous membranes are moist.  Eyes:     Conjunctiva/sclera: Conjunctivae normal.  Cardiovascular:     Rate and Rhythm: Normal rate.  Pulmonary:     Effort: Pulmonary effort Kelly normal.  Abdominal:     General: Abdomen Kelly flat.     Palpations: Abdomen Kelly soft.  Musculoskeletal:     Right lower leg: Edema present.     Left lower leg: Edema present.     Comments: No swelling to the knee or ankle.  Bilateral lower extremity edema present.  2+ DP pulses bilaterally.  5 out of 5 plantarflexion dorsiflexion bilaterally.  Full pain-free passive ROM of right knee.  Soft compartments.  Skin:    Capillary Refill: Capillary refill takes less than 2 seconds.  Neurological:     Mental Status: [name] alert. Mental status Kelly at baseline.  Psychiatric:        Mood and Affect: Mood normal.        Behavior: Behavior normal.     (all labs ordered are listed, but only abnormal results are displayed) Labs Reviewed  CBC  BASIC METABOLIC  PANEL WITH GFR  D-DIMER, QUANTITATIVE    EKG: None  Radiology: No results found.  {Document cardiac monitor, telemetry assessment procedure when appropriate:32947} Procedures   Medications Ordered in the ED  acetaminophen  (TYLENOL ) tablet 650 mg (has no administration in time range)      {Click here for ABCD2, HEART and other calculators REFRESH Note before signing:1}                              Medical Decision Making [name] a 89 year old Dodson complicated past medical history of prior DVT, hypertension, hyperlipidemia presenting emergency department for right leg calf pain.  [name] afebrile nontachycardic, slightly hypertensive.  Physical exam largely reassuring.  Do not appreciate obvious size difference in legs.  Has pitting bilateral lower extremity edema.   Will get x-ray to evaluate for osseous pathology.  Concern for DVT as daughter notes that she does have a history of the same.  Will get basic labs and D-dimer.  Unfortunately do not have ultrasound currently, may need Lovenox  and return ultrasound if D-dimer significantly elevated.  Given Tylenol  for pain.  Amount and/or Complexity of Data Reviewed Independent Historian:     Details: Family member notes roughly 4 hours in the car today. External Data Reviewed:     Details: Does not appear to be on blood thinner Labs: ordered. Decision-making details documented in ED Course. Radiology: ordered and independent interpretation performed.  Risk OTC drugs. Decision regarding hospitalization. Diagnosis or treatment significantly limited by social determinants of health. Risk Details: Poor health literacy    {Document critical care time when appropriate  Document review of labs and clinical decision tools ie CHADS2VASC2, etc  Document your independent review of radiology images and any outside records  Document your discussion with family members, caretakers and with consultants  Document social determinants of health affecting pt's care  Document your decision making why or why not admission, treatments were needed:32947:::1}   Final diagnoses:  None    ED Discharge Orders     None

## 2023-10-01 NOTE — ED Triage Notes (Signed)
 R leg pain from knee down that started today. Worse with walking. Pt was able to pivot and take 1-2 steps to get into stretcher. Traveled 3 hours today in a car, and pain started after that.

## 2023-10-01 NOTE — ED Notes (Signed)
 RN@[email or web address]  to draw labs; will attempt imaging again shortly.

## 2023-10-03 DIAGNOSIS — I10 Essential (primary) hypertension: Secondary | ICD-10-CM | POA: Diagnosis not present

## 2023-10-03 DIAGNOSIS — E876 Hypokalemia: Secondary | ICD-10-CM | POA: Diagnosis not present

## 2023-10-03 DIAGNOSIS — M79604 Pain in right leg: Secondary | ICD-10-CM | POA: Diagnosis not present

## 2023-10-03 DIAGNOSIS — E782 Mixed hyperlipidemia: Secondary | ICD-10-CM | POA: Diagnosis not present

## 2023-10-05 DIAGNOSIS — N390 Urinary tract infection, site not specified: Secondary | ICD-10-CM | POA: Diagnosis not present

## 2023-10-16 NOTE — Therapy (Incomplete)
 [name] LOWER EXTREMITY EVALUATION   Patient Name: Kelly Dodson MRN: [id number] DOB:14-Oct-1934, 89 y.o., female Today's Date: 10/19/2023   END OF SESSION:  PT End of Session - 10/19/23 0947     Visit Number 1   11 minutes late for appointment   Date for Recertification  12/14/23    PT Start Time 0941    PT Stop Time 1014    PT Time Calculation (min) 33 min          Past Medical History:  Diagnosis Date   Hypercholesteremia    Hypertension    History reviewed. No pertinent surgical history. Patient Active Problem List   Diagnosis Date Noted   Near syncope 10/31/2022    PCP: Benjamine Aland, MD   REFERRING PROVIDER: Benjamine Aland, MD   REFERRING DIAG: [phone] (ICD-10-CM) - Pain in right knee M79.661 (ICD-10-CM) - Pain in right lower leg  THERAPY DIAG:  Acute pain of right knee  Other abnormalities of gait and mobility  Stiffness of right knee, not elsewhere classified  Muscle weakness (generalized)  RATIONALE FOR EVALUATION AND TREATMENT: Rehabilitation  ONSET DATE: 10/01/23  NEXT MD VISIT:    SUBJECTIVE:                                                                                                                                                                                                         SUBJECTIVE STATEMENT:   EVAL:  Patient is an 89 y/o referred to PT from PCP for R knee and RLE pain.   Patient is accompanied by her dtr  today who gives a more detailed history as patient has some difficulty providing.   Dtr reports Acute onset right lower leg pain today after a car trip of several hours; began around noon.  States they went to a funeral in the [place], [place] area and that when they stopped for a restroom break the patient had R knee pain as soon as she got out of the car.   They deny any twisting motion or fall.   Patient denies any prior falls.   States has never had any R knee pain in the past.   States pain continued for the  duration of the day and only continued to worsen.   She was not wearing high heels and denies rolling her ankle, etc.  States pain just started for no apparent reason.  Never had it before.  She came to the ED here when she arrived home and had an Xray and D-dimer that were negative (  no US  since no US  tech was available).  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  She has continued to have pain for the last 3 weeks that has severely limited her.   She saw her PCP recently and was referred to PT.   Patient is normally very active for 89 y/o.   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   However, she is using a cane on arrival to the clinic and is very unsteady, limping heavily on the RLE  PAIN: Are you having pain? Yes: NPRS scale: 0/10 rest;  7/10 with WB positions Pain location: L knee Pain description: sharp, stabbing, aching Aggravating factors: walking Relieving factors: NWB positions with leg elevation  PERTINENT HISTORY:  h/o DVT RLE, HTN, hyperlipidemia, near syncope,   PRECAUTIONS: None  RED FLAGS: None  WEIGHT BEARING RESTRICTIONS: No  FALLS:  Has patient fallen in last 6 months? No  LIVING ENVIRONMENT: Lives with: lives alone Lives in: House/apartment Stairs: Yes: External: 1 steps; none Has following equipment at home: Single point cane  OCCUPATION: retired from driving school bus;  still works as a Chief Strategy Officer  PLOF: Independent with gait  PATIENT GOALS: not hurt in my R knee   OBJECTIVE: (objective measures completed at initial evaluation unless otherwise dated)  DIAGNOSTIC FINDINGS:  CLINICAL DATA:  Right lower leg pain.   EXAM: RIGHT TIBIA AND FIBULA - 2 VIEW   COMPARISON:  None Available.   FINDINGS: No acute bony abnormality. Specifically, no fracture, subluxation, or dislocation. No joint effusion in the right knee. Scattered soft tissue calcifications.    IMPRESSION: No acute bony abnormality.     Electronically Signed   By: Franky Crease M.D.   On: 10/01/2023 23:06  PATIENT SURVEYS:   Extreme difficulty/unable (0), Quite a bit of difficulty (1), Moderate difficulty (2), Little difficulty (3), No difficulty (4) Survey date:  10/19/2023   Any of your usual work, housework or school activities 1  2. Usual hobbies, recreational or sporting activities 1  3. Getting into/out of the bath 1  4. Walking between rooms 1  5. Putting on socks/shoes 1  6. Squatting  1  7. Lifting an object, like a bag of groceries from the floor 1  8. Performing light activities around your home 1  9. Performing heavy activities around your home 0  10. Getting into/out of a car 1  11. Walking 2 blocks 0  12. Walking 1 mile 0  13. Going up/down 10 stairs (1 flight) 0  14. Standing for 1 hour 0  15.  sitting for 1 hour 1  16. Running on even ground 0  17. Running on uneven ground 0  18. Making sharp turns while running fast 0  19. Hopping  0  20. Rolling over in bed 1  Score total:  11    COGNITION: Overall cognitive status: Within functional limits for tasks assessed    SENSATION: WFL  EDEMA:  Circumferential:    Patellar:  LLE = 39 cm;    RLE = 41.5 cm  Mid calf LLE = 35 cm;  RLE = 37.5 cm   POSTURE:  No Significant postural limitations  PALPATION: Somewhat TTP around the medial joint lines  MUSCLE LENGTH: Hamstrings: Right SLR = 70 deg; Left SLR = 80 deg Hamstrings: mild tightness ITB: mild tight Piriformis: mild tight  LOWER EXTREMITY ROM:  Active ROM Right eval Left  eval  Hip flexion    Hip extension    Hip abduction    Hip adduction    Hip internal rotation    Hip external rotation    Knee flexion 111 132  Knee extension -4 0  Ankle dorsiflexion    Ankle plantarflexion    Ankle inversion    Ankle eversion     Blank rows not tested)  LOWER EXTREMITY MMT:  MMT Right eval Left eval  Hip flexion 3+ 4-  Hip extension     Hip abduction    Hip adduction    Hip internal rotation 4- 4+  Hip external rotation 4+ 4+  Knee flexion 4 5  Knee extension 4+ 5  Ankle dorsiflexion 5 5  Ankle plantarflexion 4 4  Ankle inversion    Ankle eversion     (Blank rows = not tested)  LOWER EXTREMITY SPECIAL TESTS:  Knee special tests: Anterior drawer test: negative, Posterior drawer test: negative, Lachman Test: negative, and Pivot shift test: negative  FUNCTIONAL TESTS:  5 times sit to stand: 43.91 sec Timed up and go (TUG): 38.37  GAIT: Distance walked: into clinic x 200' Assistive device utilized: Single point cane Level of assistance: Min A Gait pattern: severe limp RLE, holds cane in the RUE incorrectly, needs min assist to maintain balance, holding walls with LUE coming into clinic today, very unsteady and unsafe;  high fall risk Comments:    TODAY'S TREATMENT:  10/19/23 SELF CARE: Provided education on PT POC progression, to improve safety with use of walker for assistive device, and to reduce fall risk.; initial HEP  PATIENT EDUCATION:  Education details: PT eval findings, anticipated POC, and initial HEP  Person educated: Patient Education method: Explanation, Demonstration, Verbal cues, Tactile cues, and Handouts Education comprehension: verbalized understanding, verbal cues required, tactile cues required, and needs further education  HOME EXERCISE PROGRAM: Access Code: [email or web address] URL: [email or web address] Date: 10/19/2023 Prepared by: Garnette Montclair  Exercises - Supine Ankle Pumps  - 1 x daily - 7 x weekly - 3 sets - 10 reps - Supine Heel Slides  - 1 x daily - 7 x weekly - 3 sets - 10 reps - Supine Short Arc Quad  - 1 x daily - 7 x weekly - 3 sets - 10 reps - Supine Hip Abduction  - 1 x daily - 7 x weekly - 1-2 sets - 10 reps - Supine Straight Leg Raises  - 1 x daily - 7 x weekly - 1-2 sets - 10 reps - Standing Ankle Dorsiflexion with Table Support  - 1 x daily - 7 x weekly -  3 sets - 10 reps - Standing Heel Raises  - 1 x daily - 7 x weekly - 3 sets - 10 reps   ASSESSMENT:  CLINICAL IMPRESSION: SHEYNA PETTIBONE is a 89 y.o. female who was referred to physical therapy for evaluation and treatment for R knee and lower leg pain.    Patient reports onset of R knee pain beginning 10/01/23 during a car ride to [place], [place] to go to a funeral.   When she got out for a restroom break she began having R knee pain for no apparent reason.   No h/o trauma, fall, twist, etc.    Very high functioning patient.  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 Pain is worse with any WB position on the RLE.  She gets full relief when she sits down and rests.  The  pain has really not gotten a great deal better since 10/01/23.   Her RLE is about 1 inch large than the LLE at the kne and at the calf.   She does have a h/o DVT.  However, her only pain is in the R knee with WB.   She has some crepitus with AROM, but no more than the L knee.   Patient has deficits in R knee ROM, R LE flexibility, RLE strength, and pain mainly in the R knee which are interfering with ADLs and are impacting quality of life.  On LEFS patient scored 11/80 demonstrating severe functional limitation.  Kelly Dodson will benefit from skilled PT to address above deficits to improve mobility and activity tolerance with decreased pain interference.  OBJECTIVE IMPAIRMENTS: Abnormal gait, decreased ROM, decreased strength, and pain.   ACTIVITY LIMITATIONS: carrying, lifting, bending, standing, squatting, stairs, and locomotion level  PARTICIPATION LIMITATIONS: cleaning, laundry, shopping, community activity, and occupation  PERSONAL FACTORS: Age, Time since onset of injury/illness/exacerbation, and 1-2 comorbidities: HTN,h/o RLE DVT, near syncope  are also affecting patient's functional outcome.   REHAB POTENTIAL: Good  CLINICAL DECISION MAKING: Evolving/moderate complexity  EVALUATION COMPLEXITY:  Moderate   GOALS: Goals reviewed with patient? Yes  SHORT TERM GOALS: Target date: 11/16/2023   Patient will be independent with initial HEP. Baseline: 100% PT assist required for correct completion Goal status: INITIAL  2.  Patient will report at least 25% improvement in R knee pain to improve QOL. Baseline: 7/10 worst Goal status: INITIAL   LONG TERM GOALS: Target date: 12/14/2023   Patient will be independent with advanced/ongoing HEP to improve outcomes and carryover.  Baseline: no advanced HEP yet Goal status: INITIAL  2.  Patient will report at least 50-75% improvement in R knee pain to improve QOL. Baseline: 7/10  worst Goal status: INITIAL  3.  Patient will demonstrate improved R knee AROM to >/= 0-125 deg to allow for normal gait and stair mechanics. Baseline: Refer to above LE ROM table Goal status: INITIAL  4.  Patient will demonstrate improved RLE strength to >/= 5/5 for improved stability and ease of mobility. Baseline: Refer to above LE MMT table Goal status: INITIAL  5.  Patient will be able to ambulate 600' with LRAD and normal gait pattern without increased pain to access community.  Baseline: pain with any ambulation Goal status: INITIAL  6. Patient will be able to ascend/descend stairs with 1 HR and reciprocal step pattern safely to access home and community.  Baseline: unable to go reciprocally due to pain Goal status: INITIAL  7.  Patient will report >/= 30/80 on LEFS (MCID = 9 pts) to demonstrate improved functional ability. Baseline: 11 Goal status: INITIAL  8.  Patient will demonstrate at least 19/24 on DGI to decrease risk of falls. Baseline: TBD Goal status: INITIAL   9.  Patient will improve on TUG score to </= 18 sec to reduce fall risk and function normally in the community Baseline: 38.37 Goal status: INITIAL   10.  Patient will improve on 5X sit to stand test to </= 20 sec to have functional R knee strength   Baseline:  43  sec   Goal Status:  INITIAL  PLAN:  PT FREQUENCY: 1-2x/week  PT DURATION: 8 weeks  PLANNED INTERVENTIONS: 97164- PT Re-evaluation, 97750- Physical Performance Testing, 97110-Therapeutic exercises, 97530- Therapeutic activity, [id number]- Neuromuscular re-education, 97535- Self Care, [id number]- Manual therapy, [id number]- Gait training, H9716- Electrical stimulation (unattended), 97016- Vasopneumatic device, [id number]- Ultrasound, [id number]- Ionotophoresis  4mg /ml Dexamethasone, Patient/Family education, Balance training, Stair training, Taping, Joint mobilization, Cryotherapy, and Moist heat  PLAN FOR NEXT SESSION: Gently progress R knee strengthening, modalities if patient can tolerate or taping   Fredick Schlosser, PT 10/19/2023, 12:22 PM  Date of referral: see referral Referring provider: Benjamine Primmer MD Referring diagnosis? R knee and R leg pain Treatment diagnosis? (if different than referring diagnosis) R knee pain, Gait abnormality, muscle weakness, R knee stiffness  What was this (referring dx) caused by? Other: unknown cause  Nature of Condition: Initial Onset (within last 3 months)   Laterality: Rt  Current Functional Measure Score: LEFS 11/80  Objective measurements identify impairments when they are compared to normal values, the uninvolved extremity, and prior level of function.  [x]  Yes  []  No  Objective assessment of functional ability: Severe functional limitations   Briefly describe symptoms: Patient is an 89 y/o referred to PT from PCP for R knee and RLE pain.   Patient is accompanied by her dtr  today who gives a more detailed history as patient has some difficulty providing.   Dtr reports Acute onset right lower leg pain today after a car trip of several hours; began around noon.  States they went to a funeral in the [place], [place] area and that when they stopped for a restroom break the patient had R knee pain as soon as she got out of the car.   They deny any twisting motion or fall.    Patient denies any prior falls.   States has never had any R knee pain in the past.   States pain continued for the duration of the day and only continued to worsen.   She was not wearing high heels and denies rolling her ankle, etc.  States pain just started for no apparent reason.  Never had it before.  She came to the ED here when she arrived home and had an Xray and D-dimer that were negative (no US  since no US  tech was available).  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  She has continued to have pain for the last 3 weeks that has severely limited her.   She saw her PCP recently and was referred to PT.   Patient is normally very active for 89 y/o.   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   However, she is using a cane on arrival to the clinic and is very unsteady, limping heavily on the RLE   How did symptoms start: insidiously  Average pain intensity:  Last 24 hours: 8/10 worst with WB RLE  Past week: same  How often does the pt experience symptoms? Frequently  How much have the symptoms interfered with usual daily activities? Quite a bit  How has condition changed since care began at this facility? A little worse  In general, how is the patients overall health? Very Good   BACK PAIN (STarT Back Screening Tool) No

## 2023-10-19 ENCOUNTER — Ambulatory Visit: Attending: Family Medicine | Admitting: Rehabilitation

## 2023-10-19 ENCOUNTER — Other Ambulatory Visit: Payer: Self-pay

## 2023-10-19 ENCOUNTER — Encounter: Payer: Self-pay | Admitting: Sports Medicine

## 2023-10-19 ENCOUNTER — Ambulatory Visit: Admitting: Sports Medicine

## 2023-10-19 ENCOUNTER — Encounter: Payer: Self-pay | Admitting: Rehabilitation

## 2023-10-19 VITALS — BP 136/72 | Ht 63.0 in | Wt 140.0 lb

## 2023-10-19 DIAGNOSIS — M25661 Stiffness of right knee, not elsewhere classified: Secondary | ICD-10-CM | POA: Insufficient documentation

## 2023-10-19 DIAGNOSIS — M6281 Muscle weakness (generalized): Secondary | ICD-10-CM | POA: Diagnosis not present

## 2023-10-19 DIAGNOSIS — M25561 Pain in right knee: Secondary | ICD-10-CM | POA: Insufficient documentation

## 2023-10-19 DIAGNOSIS — M1711 Unilateral primary osteoarthritis, right knee: Secondary | ICD-10-CM

## 2023-10-19 DIAGNOSIS — R2689 Other abnormalities of gait and mobility: Secondary | ICD-10-CM | POA: Diagnosis not present

## 2023-10-19 MED ORDER — METHYLPREDNISOLONE ACETATE 40 MG/ML IJ SUSP
40.0000 mg | Freq: Once | INTRAMUSCULAR | Status: AC
Start: 2023-10-19 — End: 2023-10-19
  Administered 2023-10-19: 40 mg via INTRA_ARTICULAR

## 2023-10-19 NOTE — Progress Notes (Signed)
   Subjective:    Patient ID: Kelly Dodson, female    DOB: 04-Jun-1934, 89 y.o.   MRN: [id number]  HPI chief complaint: Right knee pain and swelling  Patient is a very pleasant 89 year old female that is accompanied today by her daughter.  She describes a sudden onset of knee pain on September 6  without any known trauma.  Pain was initially in the posterior knee and calf.  She was seen at the emergency department where x-rays of her tib-fib were obtained.  Nothing acute was seen.  Mild degenerative changes are seen on those non-standing x-rays.  She does have a history of DVT so D-dimer was ordered which was negative.  She was discharged home and was being evaluated by physical therapy earlier this morning when her physical therapist recommended that she see us  instead.  Pain is diffuse throughout the knee now.  The swelling in the lower leg has improved somewhat.  Pain is worse when ambulating.  She ambulates today with the assistance of a walking stick.  She has osteoarthritis in other parts of her body but has not had any significant knee issues in the past.  She does notice pain relief with Tylenol  but does not like to take it.  Past medical history reviewed Medications reviewed Allergies reviewed  Review of Systems As above    Objective:   Physical Exam  Well-developed, well-nourished.  No acute distress  Right knee: Range of motion is 0 to about 90 degrees.  Trace effusion.  She is tender to palpation along the medial joint line.  Good ligamentous stability.  There is a mild amount of swelling diffusely throughout the right lower extremity.  Negative Homans.  Good pulses.  Walking with an antalgic gait and the assistance of a walking stick.      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  I do not appreciate any palpable cyst on today's exam but she does have a small  joint effusion.  I recommended that we proceed with a cortisone injection.  Patient and her daughter agree.  She was accomplished atraumatically under sterile technique.  She tolerates this without difficulty.  I think she should continue with physical therapy.  I have also encouraged her to continue with Tylenol  as needed if she finds it helpful.  If symptoms persist despite today's injection, then return to the office for reevaluation and standing x-rays of the right knee.  Otherwise, continue with activity as tolerated and follow-up as needed.  Consent obtained and verified. Time-out conducted. Noted no overlying erythema, induration, or other signs of local infection. Skin prepped in a sterile fashion. Topical analgesic spray: Ethyl chloride. Joint: Right knee, anterior lateral approach Needle: 25-gauge 1.5 inch needle Completed without difficulty. Meds: 3 cc 1% Xylocaine, 1 cc (40 mg) Depo-Medrol   This note was dictated using Dragon naturally speaking software and may contain errors in syntax, spelling, or content which have not been identified prior to signing this note.

## 2023-10-20 DIAGNOSIS — N390 Urinary tract infection, site not specified: Secondary | ICD-10-CM | POA: Diagnosis not present

## 2023-10-20 DIAGNOSIS — I10 Essential (primary) hypertension: Secondary | ICD-10-CM | POA: Diagnosis not present

## 2023-10-25 DIAGNOSIS — Z4689 Encounter for fitting and adjustment of other specified devices: Secondary | ICD-10-CM | POA: Diagnosis not present

## 2023-10-26 ENCOUNTER — Ambulatory Visit: Attending: Family Medicine

## 2023-10-26 DIAGNOSIS — M6281 Muscle weakness (generalized): Secondary | ICD-10-CM | POA: Diagnosis present

## 2023-10-26 DIAGNOSIS — M25661 Stiffness of right knee, not elsewhere classified: Secondary | ICD-10-CM | POA: Insufficient documentation

## 2023-10-26 DIAGNOSIS — R2689 Other abnormalities of gait and mobility: Secondary | ICD-10-CM | POA: Diagnosis present

## 2023-10-26 DIAGNOSIS — M25561 Pain in right knee: Secondary | ICD-10-CM | POA: Diagnosis present

## 2023-10-26 NOTE — Therapy (Signed)
 [name] LOWER [facility]TREM[facility]REATMENT   Patient Name: Kelly Dodson MRN: [id number] DOB:[birth year], 89 y.o., female Today's Date: 10/26/2023   END OF SESSION:  PT End of Session - 10/26/23 1249     Visit Number 2    Date for Recertification  12/14/23    PT Start Time 1148    PT Stop Time 1233    PT Time Calculation (min) 45 min           Past Medical History:  Diagnosis Date   Hypercholesteremia    Hypertension    History reviewed. No pertinent surgical history. Patient Active Problem List   Diagnosis Date Noted   Near syncope 10/31/2022    PCP: Benjamine Aland, MD   REFERRING PROVIDER: Benjamine Aland, MD   REFERRING DIAG: [phone] (ICD-10-CM) - Pain in right knee [phone] (ICD-10-CM) - Pain in right lower leg  THERAPY DIAG:  Acute pain of right knee  Other abnormalities of gait and mobility  Stiffness of right knee, not elsewhere classified  Muscle weakness (generalized)  RATIONALE FOR EVALUATION AND TREATMENT: Rehabilitation  ONSET DATE: 10/01/23  NEXT MD VISIT:    SUBJECTIVE:                                                                                                                                                                                                         SUBJECTIVE STATEMENT: Pt reports she feels better today   EVAL:  Patient is an 89 y/o referred to PT from PCP for R knee and RLE pain.   Patient is accompanied by her dtr  today who gives a more detailed history as patient has some difficulty providing.   Dtr reports Acute onset right lower leg pain today after a car trip of several hours; began around noon.  States they went to a funeral in the [place], [place] area and that when they stopped for a restroom break the patient had R knee pain as soon as she got out of the car.   They deny any twisting motion or fall.   Patient denies any prior falls.   States has never had any R knee pain in the past.   States pain continued for the  duration of the day and only continued to worsen.   She was not wearing high heels and denies rolling her ankle, etc.  States pain just started for no apparent reason.  Never had it before.  She came to the ED here when she arrived home and had an Xray and D-dimer that were  negative (no US  since no US  tech was available).  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  She has continued to have pain for the last 3 weeks that has severely limited her.   She saw her PCP recently and was referred to PT.   Patient is normally very active for 89 y/o.   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   However, she is using a cane on arrival to the clinic and is very unsteady, limping heavily on the RLE  PAIN: Are you having pain? Yes: NPRS scale: 0/10 rest;  7/10 with WB positions Pain location: L knee Pain description: sharp, stabbing, aching Aggravating factors: walking Relieving factors: NWB positions with leg elevation  PERTINENT HISTORY:  h/o DVT RLE, HTN, hyperlipidemia, near syncope,   PRECAUTIONS: None  RED FLAGS: None  WEIGHT BEARING RESTRICTIONS: No  FALLS:  Has patient fallen in last 6 months? No  LIVING ENVIRONMENT: Lives with: lives alone Lives in: House/apartment Stairs: Yes: External: 1 steps; none Has following equipment at home: Single point cane  OCCUPATION: retired from driving school bus;  still works as a Chief Strategy Officer  PLOF: Independent with gait  PATIENT GOALS: not hurt in my R knee   OBJECTIVE: (objective measures completed at initial evaluation unless otherwise dated)  DIAGNOSTIC FINDINGS:  CLINICAL DATA:  Right lower leg pain.   EXAM: RIGHT TIBIA AND FIBULA - 2 VIEW   COMPARISON:  None Available.   FINDINGS: No acute bony abnormality. Specifically, no fracture, subluxation, or dislocation. No joint effusion in the right knee. Scattered soft tissue calcifications.    IMPRESSION: No acute bony abnormality.     Electronically Signed   By: Franky Crease M.D.   On: 10/01/2023 23:06  PATIENT SURVEYS:   Extreme difficulty/unable (0), Quite a bit of difficulty (1), Moderate difficulty (2), Little difficulty (3), No difficulty (4) Survey date:  10/26/2023   Any of your usual work, housework or school activities 1  2. Usual hobbies, recreational or sporting activities 1  3. Getting into/out of the bath 1  4. Walking between rooms 1  5. Putting on socks/shoes 1  6. Squatting  1  7. Lifting an object, like a bag of groceries from the floor 1  8. Performing light activities around your home 1  9. Performing heavy activities around your home 0  10. Getting into/out of a car 1  11. Walking 2 blocks 0  12. Walking 1 mile 0  13. Going up/down 10 stairs (1 flight) 0  14. Standing for 1 hour 0  15.  sitting for 1 hour 1  16. Running on even ground 0  17. Running on uneven ground 0  18. Making sharp turns while running fast 0  19. Hopping  0  20. Rolling over in bed 1  Score total:  11    COGNITION: Overall cognitive status: Within functional limits for tasks assessed    SENSATION: WFL  EDEMA:  Circumferential:    Patellar:  LLE = 39 cm;    RLE = 41.5 cm  Mid calf LLE = 35 cm;  RLE = 37.5 cm   POSTURE:  No Significant postural limitations  PALPATION: Somewhat TTP around the medial joint lines  MUSCLE LENGTH: Hamstrings: Right SLR = 70 deg; Left SLR = 80 deg Hamstrings: mild tightness ITB: mild tight Piriformis: mild tight  LOWER EXTREMITY ROM:  Active ROM Right eval  Left eval  Hip flexion    Hip extension    Hip abduction    Hip adduction    Hip internal rotation    Hip external rotation    Knee flexion 111 132  Knee extension -4 0  Ankle dorsiflexion    Ankle plantarflexion    Ankle inversion    Ankle eversion     Blank rows not tested)  LOWER EXTREMITY MMT:  MMT Right eval Left eval  Hip flexion 3+ 4-  Hip extension     Hip abduction    Hip adduction    Hip internal rotation 4- 4+  Hip external rotation 4+ 4+  Knee flexion 4 5  Knee extension 4+ 5  Ankle dorsiflexion 5 5  Ankle plantarflexion 4 4  Ankle inversion    Ankle eversion     (Blank rows = not tested)  LOWER EXTREMITY SPECIAL TESTS:  Knee special tests: Anterior drawer test: negative, Posterior drawer test: negative, Lachman Test: negative, and Pivot shift test: negative  FUNCTIONAL TESTS:  5 times sit to stand: 43.91 sec Timed up and go (TUG): 38.37  GAIT: Distance walked: into clinic x 200' Assistive device utilized: Single point cane Level of assistance: Min A Gait pattern: severe limp RLE, holds cane in the RUE incorrectly, needs min assist to maintain balance, holding walls with LUE coming into clinic today, very unsteady and unsafe;  high fall risk Comments:    TODAY'S TREATMENT:  10/26/23 Nustep L3x23min Standing hip abduction x 10 - challenging  Standing marching x 10  Gait around clinic no AD 90' Sit to stand x 10 no UE; x 10 with airex intermittent UE support Seated ball squeeze 15x Seated ball squeeze with LAQ x5  Seated hip ABD RTB x 10     10/19/23 SELF CARE: Provided education on PT POC progression, to improve safety with use of walker for assistive device, and to reduce fall risk.; initial HEP  PATIENT EDUCATION:  Education details: PT eval findings, anticipated POC, and initial HEP  Person educated: Patient Education method: Explanation, Demonstration, Verbal cues, Tactile cues, and Handouts Education comprehension: verbalized understanding, verbal cues required, tactile cues required, and needs further education  HOME EXERCISE PROGRAM: Access Code: [email or web address] URL: [email or web address] Date: 10/26/2023 Prepared by: Jehiel Koepp  Exercises - Supine Ankle Pumps  - 1 x daily - 7 x weekly - 3 sets - 10 reps - Supine Heel Slides  - 1 x daily - 7 x weekly - 3 sets - 10 reps - Supine Short  Arc Quad  - 1 x daily - 7 x weekly - 3 sets - 10 reps - Supine Hip Abduction  - 1 x daily - 7 x weekly - 1-2 sets - 10 reps - Supine Straight Leg Raises  - 1 x daily - 7 x weekly - 1-2 sets - 10 reps - Standing Ankle Dorsiflexion with Table Support  - 1 x daily - 7 x weekly - 3 sets - 10 reps - Standing Heel Raises  - 1 x daily - 7 x weekly - 3 sets - 10 reps - Seated Hip Adduction Isometrics with Ball  - 1 x daily - 7 x weekly - 3 sets - 10 reps - Seated Hip Abduction with Resistance  - 1 x daily - 7 x weekly - 3 sets - 10 reps   ASSESSMENT:  CLINICAL IMPRESSION: Pt was able to complete all interventions without issues. She required cues throughout the session to correct form and  technqiue. Some cues with STS to even feet and for anterior weight shift. She had some difficulty with upright posture during LAQ and ball squeeze.  Kelly Dodson is a 89 y.o. female who was referred to physical therapy for evaluation and treatment for R knee and lower leg pain.    Patient reports onset of R knee pain beginning 10/01/23 during a car ride to [place], [place] to go to a funeral.   When she got out for a restroom break she began having R knee pain for no apparent reason.   No h/o trauma, fall, twist, etc.    Very high functioning patient.  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 Pain is worse with any WB position on the RLE.  She gets full relief when she sits down and rests.  The pain has really not gotten a great deal better since 10/01/23.   Her RLE is about 1 inch large than the LLE at the kne and at the calf.   She does have a h/o DVT.  However, her only pain is in the R knee with WB.   She has some crepitus with AROM, but no more than the L knee.   Patient has deficits in R knee ROM, R LE flexibility, RLE strength, and pain mainly in the R knee which are interfering with ADLs and are impacting quality of life.  On LEFS patient scored 11/80 demonstrating severe functional  limitation.  Kelly Dodson will benefit from skilled PT to address above deficits to improve mobility and activity tolerance with decreased pain interference.  OBJECTIVE IMPAIRMENTS: Abnormal gait, decreased ROM, decreased strength, and pain.   ACTIVITY LIMITATIONS: carrying, lifting, bending, standing, squatting, stairs, and locomotion level  PARTICIPATION LIMITATIONS: cleaning, laundry, shopping, community activity, and occupation  PERSONAL FACTORS: Age, Time since onset of injury/illness/exacerbation, and 1-2 comorbidities: HTN,h/o RLE DVT, near syncope  are also affecting patient's functional outcome.   REHAB POTENTIAL: Good  CLINICAL DECISION MAKING: Evolving/moderate complexity  EVALUATION COMPLEXITY: Moderate   GOALS: Goals reviewed with patient? Yes  SHORT TERM GOALS: Target date: 11/16/2023   Patient will be independent with initial HEP. Baseline: 100% PT assist required for correct completion Goal status: IN PROGRESS- 10/26/23 reports compliance  2.  Patient will report at least 25% improvement in R knee pain to improve QOL. Baseline: 7/10 worst Goal status: INITIAL   LONG TERM GOALS: Target date: 12/14/2023   Patient will be independent with advanced/ongoing HEP to improve outcomes and carryover.  Baseline: no advanced HEP yet Goal status: INITIAL  2.  Patient will report at least 50-75% improvement in R knee pain to improve QOL. Baseline: 7/10  worst Goal status: INITIAL  3.  Patient will demonstrate improved R knee AROM to >/= 0-125 deg to allow for normal gait and stair mechanics. Baseline: Refer to above LE ROM table Goal status: INITIAL  4.  Patient will demonstrate improved RLE strength to >/= 5/5 for improved stability and ease of mobility. Baseline: Refer to above LE MMT table Goal status: INITIAL  5.  Patient will be able to ambulate 600' with LRAD and normal gait pattern without increased pain to access community.  Baseline: pain with any  ambulation Goal status: INITIAL  6. Patient will be able to ascend/descend stairs with 1 HR and reciprocal step pattern safely to access home and community.  Baseline: unable to go reciprocally due to pain Goal status: INITIAL  7.  Patient will report >/= 30/80  on LEFS (MCID = 9 pts) to demonstrate improved functional ability. Baseline: 11 Goal status: INITIAL  8.  Patient will demonstrate at least 19/24 on DGI to decrease risk of falls. Baseline: TBD Goal status: INITIAL   9.  Patient will improve on TUG score to </= 18 sec to reduce fall risk and function normally in the community Baseline: 38.37 Goal status: INITIAL   10.  Patient will improve on 5X sit to stand test to </= 20 sec to have functional R knee strength   Baseline:  43 sec   Goal Status:  INITIAL  PLAN:  PT FREQUENCY: 1-2x/week  PT DURATION: 8 weeks  PLANNED INTERVENTIONS: 97164- PT Re-evaluation, 97750- Physical Performance Testing, 97110-Therapeutic exercises, 97530- Therapeutic activity, [id number]- Neuromuscular re-education, 97535- Self Care, [id number]- Manual therapy, [id number]- Gait training, [phone]- Electrical stimulation (unattended), 97016- Vasopneumatic device, 97035- Ultrasound, 02966- Ionotophoresis 4mg /ml Dexamethasone, Patient/Family education, Balance training, Stair training, Taping, Joint mobilization, Cryotherapy, and Moist heat  PLAN FOR NEXT SESSION: Gently progress R knee strengthening, modalities if patient can tolerate or taping   Jaeli Grubb L Labrea Eccleston, PTA 10/26/2023, 12:49 PM  Date of referral: see referral Referring provider: Benjamine Primmer MD Referring diagnosis? R knee and R leg pain Treatment diagnosis? (if different than referring diagnosis) R knee pain, Gait abnormality, muscle weakness, R knee stiffness  What was this (referring dx) caused by? Other: unknown cause  Nature of Condition: Initial Onset (within last 3 months)   Laterality: Rt  Current Functional Measure Score: LEFS  11/80  Objective measurements identify impairments when they are compared to normal values, the uninvolved extremity, and prior level of function.  [x]  Yes  []  No  Objective assessment of functional ability: Severe functional limitations   Briefly describe symptoms: Patient is an 89 y/o referred to PT from PCP for R knee and RLE pain.   Patient is accompanied by her dtr  today who gives a more detailed history as patient has some difficulty providing.   Dtr reports Acute onset right lower leg pain today after a car trip of several hours; began around noon.  States they went to a funeral in the [place], [place] area and that when they stopped for a restroom break the patient had R knee pain as soon as she got out of the car.   They deny any twisting motion or fall.   Patient denies any prior falls.   States has never had any R knee pain in the past.   States pain continued for the duration of the day and only continued to worsen.   She was not wearing high heels and denies rolling her ankle, etc.  States pain just started for no apparent reason.  Never had it before.  She came to the ED here when she arrived home and had an Xray and D-dimer that were negative (no US  since no US  tech was available).  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  She has continued to have pain for the last 3 weeks that has severely limited her.   She saw her PCP recently and was referred to PT.   Patient is normally very active for 89 y/o.   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   However, she is using a cane on arrival to the clinic and is very unsteady, limping heavily on the RLE   How did symptoms start: insidiously  Average pain intensity:  Last  24 hours: 8/10 worst with WB RLE  Past week: same  How often does the pt experience symptoms? Frequently  How much have the symptoms interfered with usual daily activities? Quite a bit  How has  condition changed since care began at this facility? A little worse  In general, how is the patients overall health? Very Good   BACK PAIN (STarT Back Screening Tool) No

## 2023-11-01 ENCOUNTER — Encounter: Payer: Self-pay | Admitting: Rehabilitation

## 2023-11-02 ENCOUNTER — Ambulatory Visit

## 2023-11-02 DIAGNOSIS — R2689 Other abnormalities of gait and mobility: Secondary | ICD-10-CM

## 2023-11-02 DIAGNOSIS — M25661 Stiffness of right knee, not elsewhere classified: Secondary | ICD-10-CM

## 2023-11-02 DIAGNOSIS — M25561 Pain in right knee: Secondary | ICD-10-CM

## 2023-11-02 DIAGNOSIS — M6281 Muscle weakness (generalized): Secondary | ICD-10-CM

## 2023-11-02 NOTE — Therapy (Signed)
 [name] LOWER [facility]TREM[facility]REAT[facility]   Patient Name: Kelly Dodson MRN: [id number] DOB:27-May-1934, 89 y.o., female Today's Date: 11/02/2023   END OF SESSION:  PT End of Session - 11/02/23 1018     Visit Number 3    Date for Recertification  12/14/23    PT Start Time 0939   pt late   PT Stop Time 1017    PT Time Calculation (min) 38 min            Past Medical History:  Diagnosis Date   Hypercholesteremia    Hypertension    History reviewed. No pertinent surgical history. Patient Active Problem List   Diagnosis Date Noted   Near syncope 10/31/2022    PCP: Benjamine Aland, MD   REFERRING PROVIDER: Benjamine Aland, MD   REFERRING DIAG: [phone] (ICD-10-CM) - Pain in right knee M79.661 (ICD-10-CM) - Pain in right lower leg  THERAPY DIAG:  Acute pain of right knee  Other abnormalities of gait and mobility  Stiffness of right knee, not elsewhere classified  Muscle weakness (generalized)  RATIONALE FOR EVALUATION AND TREATMENT: Rehabilitation  ONSET DATE: 10/01/23  NEXT MD VISIT:    SUBJECTIVE:                                                                                                                                                                                                         SUBJECTIVE STATEMENT: Pt reports L knee pain worse when first getting up and when walking   EVAL:  Patient is an 89 y/o referred to PT from PCP for R knee and RLE pain.   Patient is accompanied by her dtr  today who gives a more detailed history as patient has some difficulty providing.   Dtr reports Acute onset right lower leg pain today after a car trip of several hours; began around noon.  States they went to a funeral in the [place], [place] area and that when they stopped for a restroom break the patient had R knee pain as soon as she got out of the car.   They deny any twisting motion or fall.   Patient denies any prior falls.   States has never had any R knee pain  in the past.   States pain continued for the duration of the day and only continued to worsen.   She was not wearing high heels and denies rolling her ankle, etc.  States pain just started for no apparent reason.  Never had it before.  She came to the ED here when  she arrived home and had an Xray and D-dimer that were negative (no US  since no US  tech was available).  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  She has continued to have pain for the last 3 weeks that has severely limited her.   She saw her PCP recently and was referred to PT.   Patient is normally very active for 89 y/o.   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   However, she is using a cane on arrival to the clinic and is very unsteady, limping heavily on the RLE  PAIN: Are you having pain? Yes: NPRS scale: 0/10 rest;  7/10 with WB positions Pain location: L knee Pain description: sharp, stabbing, aching Aggravating factors: walking Relieving factors: NWB positions with leg elevation  PERTINENT HISTORY:  h/o DVT RLE, HTN, hyperlipidemia, near syncope,   PRECAUTIONS: None  RED FLAGS: None  WEIGHT BEARING RESTRICTIONS: No  FALLS:  Has patient fallen in last 6 months? No  LIVING ENVIRONMENT: Lives with: lives alone Lives in: House/apartment Stairs: Yes: External: 1 steps; none Has following equipment at home: Single point cane  OCCUPATION: retired from driving school bus;  still works as a Chief Strategy Officer  PLOF: Independent with gait  PATIENT GOALS: not hurt in my R knee   OBJECTIVE: (objective measures completed at initial evaluation unless otherwise dated)  DIAGNOSTIC FINDINGS:  CLINICAL DATA:  Right lower leg pain.   EXAM: RIGHT TIBIA AND FIBULA - 2 VIEW   COMPARISON:  None Available.   FINDINGS: No acute bony abnormality. Specifically, no fracture, subluxation, or dislocation. No joint effusion in the right knee.  Scattered soft tissue calcifications.   IMPRESSION: No acute bony abnormality.     Electronically Signed   By: Franky Crease M.D.   On: 10/01/2023 23:06  PATIENT SURVEYS:   Extreme difficulty/unable (0), Quite a bit of difficulty (1), Moderate difficulty (2), Little difficulty (3), No difficulty (4) Survey date:  11/02/2023   Any of your usual work, housework or school activities 1  2. Usual hobbies, recreational or sporting activities 1  3. Getting into/out of the bath 1  4. Walking between rooms 1  5. Putting on socks/shoes 1  6. Squatting  1  7. Lifting an object, like a bag of groceries from the floor 1  8. Performing light activities around your home 1  9. Performing heavy activities around your home 0  10. Getting into/out of a car 1  11. Walking 2 blocks 0  12. Walking 1 mile 0  13. Going up/down 10 stairs (1 flight) 0  14. Standing for 1 hour 0  15.  sitting for 1 hour 1  16. Running on even ground 0  17. Running on uneven ground 0  18. Making sharp turns while running fast 0  19. Hopping  0  20. Rolling over in bed 1  Score total:  11    COGNITION: Overall cognitive status: Within functional limits for tasks assessed    SENSATION: WFL  EDEMA:  Circumferential:    Patellar:  LLE = 39 cm;    RLE = 41.5 cm  Mid calf LLE = 35 cm;  RLE = 37.5 cm   POSTURE:  No Significant postural limitations  PALPATION: Somewhat TTP around the medial joint lines  MUSCLE LENGTH: Hamstrings: Right SLR = 70 deg; Left SLR = 80 deg Hamstrings: mild tightness ITB: mild tight Piriformis:  mild tight  LOWER EXTREMITY ROM:  Active ROM Right eval Left eval  Hip flexion    Hip extension    Hip abduction    Hip adduction    Hip internal rotation    Hip external rotation    Knee flexion 111 132  Knee extension -4 0  Ankle dorsiflexion    Ankle plantarflexion    Ankle inversion    Ankle eversion     Blank rows not tested)  LOWER EXTREMITY MMT:  MMT Right eval Left  eval  Hip flexion 3+ 4-  Hip extension    Hip abduction    Hip adduction    Hip internal rotation 4- 4+  Hip external rotation 4+ 4+  Knee flexion 4 5  Knee extension 4+ 5  Ankle dorsiflexion 5 5  Ankle plantarflexion 4 4  Ankle inversion    Ankle eversion     (Blank rows = not tested)  LOWER EXTREMITY SPECIAL TESTS:  Knee special tests: Anterior drawer test: negative, Posterior drawer test: negative, Lachman Test: negative, and Pivot shift test: negative  FUNCTIONAL TESTS:  5 times sit to stand: 43.91 sec Timed up and go (TUG): 38.37  GAIT: Distance walked: into clinic x 200' Assistive device utilized: Single point cane Level of assistance: Min A Gait pattern: severe limp RLE, holds cane in the RUE incorrectly, needs min assist to maintain balance, holding walls with LUE coming into clinic today, very unsteady and unsafe;  high fall risk Comments:    TODAY'S TREATMENT:  11/02/23 THERAPEUTIC EXERCISE: To improve strength, endurance, ROM, and flexibility.  Bike L1x50min Leg curls 10lb 2x10 BLE  THERAPEUTIC ACTIVITIES: To improve ability stand and walk with less knee pain Standing heel/toe raises x 10 [employer] pews x 10 alternating legs Holding ladder squats x 10- cues for posterior WS and equal WB Fwd step over pool noodle and back x 10 BLE  10/26/23 Nustep L3x84min Standing hip abduction x 10 - challenging  Standing marching x 10  Gait around clinic no AD 90' Sit to stand x 10 no UE; x 10 with airex intermittent UE support Seated ball squeeze 15x Seated ball squeeze with LAQ x5  Seated hip ABD RTB x 10     10/19/23 SELF CARE: Provided education on PT POC progression, to improve safety with use of walker for assistive device, and to reduce fall risk.; initial HEP  PATIENT EDUCATION:  Education details: PT eval findings, anticipated POC, and initial HEP  Person educated: Patient Education method: Explanation, Demonstration, Verbal cues, Tactile cues, and  Handouts Education comprehension: verbalized understanding, verbal cues required, tactile cues required, and needs further education  HOME EXERCISE PROGRAM: Access Code: [email or web address] URL: [email or web address] Date: 10/26/2023 Prepared by: Tateanna Bach  Exercises - Supine Ankle Pumps  - 1 x daily - 7 x weekly - 3 sets - 10 reps - Supine Heel Slides  - 1 x daily - 7 x weekly - 3 sets - 10 reps - Supine Short Arc Quad  - 1 x daily - 7 x weekly - 3 sets - 10 reps - Supine Hip Abduction  - 1 x daily - 7 x weekly - 1-2 sets - 10 reps - Supine Straight Leg Raises  - 1 x daily - 7 x weekly - 1-2 sets - 10 reps - Standing Ankle Dorsiflexion with Table Support  - 1 x daily - 7 x weekly - 3 sets - 10 reps - Standing Heel Raises  - 1 x daily - 7  x weekly - 3 sets - 10 reps - Seated Hip Adduction Isometrics with Ball  - 1 x daily - 7 x weekly - 3 sets - 10 reps - Seated Hip Abduction with Resistance  - 1 x daily - 7 x weekly - 3 sets - 10 reps   ASSESSMENT:  CLINICAL IMPRESSION: Pt was able to complete all interventions without issues. She required extensive cues throughout the session to correct form and technique with interventions. Worked on improving ability to walk and standing with less knee pain. She is noting less pain with these activities.   PEGGYE POON is a 89 y.o. female who was referred to physical therapy for evaluation and treatment for R knee and lower leg pain.    Patient reports onset of R knee pain beginning 10/01/23 during a car ride to [place], [place] to go to a funeral.   When she got out for a restroom break she began having R knee pain for no apparent reason.   No h/o trauma, fall, twist, etc.    Very high functioning patient.  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 Pain is worse with any WB position on the RLE.  She gets full relief when she sits down and rests.  The pain has really not gotten a great deal better since 10/01/23.   Her RLE  is about 1 inch large than the LLE at the kne and at the calf.   She does have a h/o DVT.  However, her only pain is in the R knee with WB.   She has some crepitus with AROM, but no more than the L knee.   Patient has deficits in R knee ROM, R LE flexibility, RLE strength, and pain mainly in the R knee which are interfering with ADLs and are impacting quality of life.  On LEFS patient scored 11/80 demonstrating severe functional limitation.  Carrieanne will benefit from skilled PT to address above deficits to improve mobility and activity tolerance with decreased pain interference.  OBJECTIVE IMPAIRMENTS: Abnormal gait, decreased ROM, decreased strength, and pain.   ACTIVITY LIMITATIONS: carrying, lifting, bending, standing, squatting, stairs, and locomotion level  PARTICIPATION LIMITATIONS: cleaning, laundry, shopping, community activity, and occupation  PERSONAL FACTORS: Age, Time since onset of injury/illness/exacerbation, and 1-2 comorbidities: HTN,h/o RLE DVT, near syncope  are also affecting patient's functional outcome.   REHAB POTENTIAL: Good  CLINICAL DECISION MAKING: Evolving/moderate complexity  EVALUATION COMPLEXITY: Moderate   GOALS: Goals reviewed with patient? Yes  SHORT TERM GOALS: Target date: 11/16/2023   Patient will be independent with initial HEP. Baseline: 100% PT assist required for correct completion Goal status: IN PROGRESS- 10/26/23 reports compliance  2.  Patient will report at least 25% improvement in R knee pain to improve QOL. Baseline: 7/10 worst Goal status: MET- 11/02/23   LONG TERM GOALS: Target date: 12/14/2023   Patient will be independent with advanced/ongoing HEP to improve outcomes and carryover.  Baseline: no advanced HEP yet Goal status: INITIAL  2.  Patient will report at least 50-75% improvement in R knee pain to improve QOL. Baseline: 7/10  worst Goal status: IN PROGRESS- 11/02/23   3.  Patient will demonstrate improved R knee AROM to >/=  0-125 deg to allow for normal gait and stair mechanics. Baseline: Refer to above LE ROM table Goal status: INITIAL  4.  Patient will demonstrate improved RLE strength to >/= 5/5 for improved stability and ease of mobility. Baseline: Refer to  above LE MMT table Goal status: INITIAL  5.  Patient will be able to ambulate 600' with LRAD and normal gait pattern without increased pain to access community.  Baseline: pain with any ambulation Goal status: INITIAL  6. Patient will be able to ascend/descend stairs with 1 HR and reciprocal step pattern safely to access home and community.  Baseline: unable to go reciprocally due to pain Goal status: INITIAL  7.  Patient will report >/= 30/80 on LEFS (MCID = 9 pts) to demonstrate improved functional ability. Baseline: 11 Goal status: INITIAL  8.  Patient will demonstrate at least 19/24 on DGI to decrease risk of falls. Baseline: TBD Goal status: INITIAL   9.  Patient will improve on TUG score to </= 18 sec to reduce fall risk and function normally in the community Baseline: 38.37 Goal status: INITIAL   10.  Patient will improve on 5X sit to stand test to </= 20 sec to have functional R knee strength   Baseline:  43 sec   Goal Status:  INITIAL  PLAN:  PT FREQUENCY: 1-2x/week  PT DURATION: 8 weeks  PLANNED INTERVENTIONS: 97164- PT Re-evaluation, 97750- Physical Performance Testing, 97110-Therapeutic exercises, 97530- Therapeutic activity, [id number]- Neuromuscular re-education, 97535- Self Care, [id number]- Manual therapy, [id number]- Gait training, [phone]- Electrical stimulation (unattended), 97016- Vasopneumatic device, [id number]- Ultrasound, 02966- Ionotophoresis 4mg /ml Dexamethasone, Patient/Family education, Balance training, Stair training, Taping, Joint mobilization, Cryotherapy, and Moist heat  PLAN FOR NEXT SESSION: Gently progress R knee strengthening, modalities if patient can tolerate or taping   Yanky Vanderburg L Armin Yerger, PTA 11/02/2023, 10:18 AM  Date  of referral: see referral Referring provider: Benjamine Primmer MD Referring diagnosis? R knee and R leg pain Treatment diagnosis? (if different than referring diagnosis) R knee pain, Gait abnormality, muscle weakness, R knee stiffness  What was this (referring dx) caused by? Other: unknown cause  Nature of Condition: Initial Onset (within last 3 months)   Laterality: Rt  Current Functional Measure Score: LEFS 11/80  Objective measurements identify impairments when they are compared to normal values, the uninvolved extremity, and prior level of function.  [x]  Yes  []  No  Objective assessment of functional ability: Severe functional limitations   Briefly describe symptoms: Patient is an 89 y/o referred to PT from PCP for R knee and RLE pain.   Patient is accompanied by her dtr  today who gives a more detailed history as patient has some difficulty providing.   Dtr reports Acute onset right lower leg pain today after a car trip of several hours; began around noon.  States they went to a funeral in the [place], [place] area and that when they stopped for a restroom break the patient had R knee pain as soon as she got out of the car.   They deny any twisting motion or fall.   Patient denies any prior falls.   States has never had any R knee pain in the past.   States pain continued for the duration of the day and only continued to worsen.   She was not wearing high heels and denies rolling her ankle, etc.  States pain just started for no apparent reason.  Never had it before.  She came to the ED here when she arrived home and had an Xray and D-dimer that were negative (no US  since no US  tech was available).  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  She has continued to have pain for the last 3 weeks that has severely  limited her.   She saw her PCP recently and was referred to PT.   Patient is normally very active for 89 y/o.   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   However, she is using a cane on arrival to the clinic and is very unsteady, limping heavily on the RLE   How did symptoms start: insidiously  Average pain intensity:  Last 24 hours: 8/10 worst with WB RLE  Past week: same  How often does the pt experience symptoms? Frequently  How much have the symptoms interfered with usual daily activities? Quite a bit  How has condition changed since care began at this facility? A little worse  In general, how is the patients overall health? Very Good   BACK PAIN (STarT Back Screening Tool) No

## 2023-11-09 ENCOUNTER — Ambulatory Visit

## 2023-11-09 DIAGNOSIS — M25561 Pain in right knee: Secondary | ICD-10-CM

## 2023-11-09 DIAGNOSIS — R2689 Other abnormalities of gait and mobility: Secondary | ICD-10-CM

## 2023-11-09 DIAGNOSIS — M25661 Stiffness of right knee, not elsewhere classified: Secondary | ICD-10-CM

## 2023-11-09 DIAGNOSIS — M6281 Muscle weakness (generalized): Secondary | ICD-10-CM

## 2023-11-09 NOTE — Therapy (Signed)
 [name] LOWER [facility]TREM[facility]REAT[facility]   Patient Name: Kelly Dodson MRN: [id number] DOB:16-Sep-1934, 89 y.o., female Today's Date: 11/09/2023   END OF SESSION:  PT End of Session - 11/09/23 0942     Visit Number 4    Date for Recertification  12/14/23    PT Start Time 0940   pt late   PT Stop Time 1019    PT Time Calculation (min) 39 min            Past Medical History:  Diagnosis Date   Hypercholesteremia    Hypertension    History reviewed. No pertinent surgical history. Patient Active Problem List   Diagnosis Date Noted   Near syncope 10/31/2022    PCP: Benjamine Aland, MD   REFERRING PROVIDER: Benjamine Aland, MD   REFERRING DIAG: [phone] (ICD-10-CM) - Pain in right knee M79.661 (ICD-10-CM) - Pain in right lower leg  THERAPY DIAG:  Acute pain of right knee  Other abnormalities of gait and mobility  Stiffness of right knee, not elsewhere classified  Muscle weakness (generalized)  RATIONALE FOR EVALUATION AND TREATMENT: Rehabilitation  ONSET DATE: 10/01/23  NEXT MD VISIT:    SUBJECTIVE:                                                                                                                                                                                                         SUBJECTIVE STATEMENT:  No pain now, walking pain has reduced since starting PT  EVAL:  Patient is an 89 y/o referred to PT from PCP for R knee and RLE pain.   Patient is accompanied by her dtr  today who gives a more detailed history as patient has some difficulty providing.   Dtr reports Acute onset right lower leg pain today after a car trip of several hours; began around noon.  States they went to a funeral in the [place], [place] area and that when they stopped for a restroom break the patient had R knee pain as soon as she got out of the car.   They deny any twisting motion or fall.   Patient denies any prior falls.   States has never had any R knee pain in the  past.   States pain continued for the duration of the day and only continued to worsen.   She was not wearing high heels and denies rolling her ankle, etc.  States pain just started for no apparent reason.  Never had it before.  She came to the ED here when she arrived home  and had an Xray and D-dimer that were negative (no US  since no US  tech was available).  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  She has continued to have pain for the last 3 weeks that has severely limited her.   She saw her PCP recently and was referred to PT.   Patient is normally very active for 89 y/o.   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   However, she is using a cane on arrival to the clinic and is very unsteady, limping heavily on the RLE  PAIN: Are you having pain? Yes: NPRS scale: 0/10 rest;  5/10 with WB positions Pain location: L knee Pain description: sharp, stabbing, aching Aggravating factors: walking Relieving factors: NWB positions with leg elevation  PERTINENT HISTORY:  h/o DVT RLE, HTN, hyperlipidemia, near syncope,   PRECAUTIONS: None  RED FLAGS: None  WEIGHT BEARING RESTRICTIONS: No  FALLS:  Has patient fallen in last 6 months? No  LIVING ENVIRONMENT: Lives with: lives alone Lives in: House/apartment Stairs: Yes: External: 1 steps; none Has following equipment at home: Single point cane  OCCUPATION: retired from driving school bus;  still works as a Chief Strategy Officer  PLOF: Independent with gait  PATIENT GOALS: not hurt in my R knee   OBJECTIVE: (objective measures completed at initial evaluation unless otherwise dated)  DIAGNOSTIC FINDINGS:  CLINICAL DATA:  Right lower leg pain.   EXAM: RIGHT TIBIA AND FIBULA - 2 VIEW   COMPARISON:  None Available.   FINDINGS: No acute bony abnormality. Specifically, no fracture, subluxation, or dislocation. No joint effusion in the right knee. Scattered  soft tissue calcifications.   IMPRESSION: No acute bony abnormality.     Electronically Signed   By: Franky Crease M.D.   On: 10/01/2023 23:06  PATIENT SURVEYS:   Extreme difficulty/unable (0), Quite a bit of difficulty (1), Moderate difficulty (2), Little difficulty (3), No difficulty (4) Survey date:  11/09/2023   Any of your usual work, housework or school activities 1  2. Usual hobbies, recreational or sporting activities 1  3. Getting into/out of the bath 1  4. Walking between rooms 1  5. Putting on socks/shoes 1  6. Squatting  1  7. Lifting an object, like a bag of groceries from the floor 1  8. Performing light activities around your home 1  9. Performing heavy activities around your home 0  10. Getting into/out of a car 1  11. Walking 2 blocks 0  12. Walking 1 mile 0  13. Going up/down 10 stairs (1 flight) 0  14. Standing for 1 hour 0  15.  sitting for 1 hour 1  16. Running on even ground 0  17. Running on uneven ground 0  18. Making sharp turns while running fast 0  19. Hopping  0  20. Rolling over in bed 1  Score total:  11    COGNITION: Overall cognitive status: Within functional limits for tasks assessed    SENSATION: WFL  EDEMA:  Circumferential:    Patellar:  LLE = 39 cm;    RLE = 41.5 cm  Mid calf LLE = 35 cm;  RLE = 37.5 cm   POSTURE:  No Significant postural limitations  PALPATION: Somewhat TTP around the medial joint lines  MUSCLE LENGTH: Hamstrings: Right SLR = 70 deg; Left SLR = 80 deg Hamstrings: mild tightness ITB: mild tight Piriformis: mild tight  LOWER EXTREMITY ROM:  Active ROM Right eval Left eval  Hip flexion    Hip extension    Hip abduction    Hip adduction    Hip internal rotation    Hip external rotation    Knee flexion 111 132  Knee extension -4 0  Ankle dorsiflexion    Ankle plantarflexion    Ankle inversion    Ankle eversion     Blank rows not tested)  LOWER EXTREMITY MMT:  MMT Right eval Left eval R/L  11/09/23  Hip flexion 3+ 4- 4/4+  Hip extension     Hip abduction     Hip adduction     Hip internal rotation 4- 4+ 4/4+  Hip external rotation 4+ 4+ 4+/4+  Knee flexion 4 5 5/5  Knee extension 4+ 5 5/5  Ankle dorsiflexion 5 5 5/4+  Ankle plantarflexion 4 4   Ankle inversion     Ankle eversion      (Blank rows = not tested)  LOWER EXTREMITY SPECIAL TESTS:  Knee special tests: Anterior drawer test: negative, Posterior drawer test: negative, Lachman Test: negative, and Pivot shift test: negative  FUNCTIONAL TESTS:  5 times sit to stand: 43.91 sec Timed up and go (TUG): 38.37  GAIT: Distance walked: into clinic x 200' Assistive device utilized: Single point cane Level of assistance: Min A Gait pattern: severe limp RLE, holds cane in the RUE incorrectly, needs min assist to maintain balance, holding walls with LUE coming into clinic today, very unsteady and unsafe;  high fall risk Comments:    TODAY'S TREATMENT:  11/09/23 Nustep L6x31min UELE LE strength test TUG- 20 seconds no AD- got confused with instructions toward the end 5xSTS- 15.66 sec Standing marching x 10 BLE Standing hip abduction x 10 BLE- cues for posture Standing hip extension x 10 BLE Counter squat x 10  11/02/23 THERAPEUTIC EXERCISE: To improve strength, endurance, ROM, and flexibility.  Bike L1x73min Leg curls 10lb 2x10 BLE  THERAPEUTIC ACTIVITIES: To improve ability stand and walk with less knee pain Standing heel/toe raises x 10 [employer] pews x 10 alternating legs Holding ladder squats x 10- cues for posterior WS and equal WB Fwd step over pool noodle and back x 10 BLE  10/26/23 Nustep L3x32min Standing hip abduction x 10 - challenging  Standing marching x 10  Gait around clinic no AD 90' Sit to stand x 10 no UE; x 10 with airex intermittent UE support Seated ball squeeze 15x Seated ball squeeze with LAQ x5  Seated hip ABD RTB x 10     10/19/23 SELF CARE: Provided education on PT POC  progression, to improve safety with use of walker for assistive device, and to reduce fall risk.; initial HEP  PATIENT EDUCATION:  Education details: HEP update Person educated: Patient Education method: Explanation, Demonstration, Verbal cues, Tactile cues, and Handouts Education comprehension: verbalized understanding, verbal cues required, tactile cues required, and needs further education  HOME EXERCISE PROGRAM: Access Code: [email or web address] URL: [email or web address] Date: 11/09/2023 Prepared by: Mase Dhondt  Exercises - Supine Ankle Pumps  - 1 x daily - 7 x weekly - 3 sets - 10 reps - Supine Heel Slides  - 1 x daily - 7 x weekly - 3 sets - 10 reps - Supine Short Arc Quad  - 1 x daily - 7 x weekly - 3 sets - 10 reps - Supine Straight Leg Raises  - 1 x daily - 7 x weekly - 1-2 sets - 10 reps -  Standing Ankle Dorsiflexion with Table Support  - 1 x daily - 7 x weekly - 3 sets - 10 reps - Seated Hip Adduction Isometrics with Ball  - 1 x daily - 7 x weekly - 3 sets - 10 reps - Seated Hip Abduction with Resistance  - 1 x daily - 7 x weekly - 3 sets - 10 reps - Standing Heel Raises  - 1 x daily - 7 x weekly - 3 sets - 10 reps - Mini Squat with Counter Support  - 1 x daily - 7 x weekly - 3 sets - 10 reps   ASSESSMENT:  CLINICAL IMPRESSION: Pt shows improvement in LE strength, TUG and 5xSTS score. She notes improving knee pain when walking by 20% based on subjective rating. Focused on standing exercises she could perform at home at counter top after testing today. She continues to improve and show benefit from continued skilled therapy.  Kelly Dodson is a 89 y.o. female who was referred to physical therapy for evaluation and treatment for R knee and lower leg pain.    Patient reports onset of R knee pain beginning 10/01/23 during a car ride to [place], [place] to go to a funeral.   When she got out for a restroom break she began having R knee pain for no apparent reason.   No h/o  trauma, fall, twist, etc.    Very high functioning patient.  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 Pain is worse with any WB position on the RLE.  She gets full relief when she sits down and rests.  The pain has really not gotten a great deal better since 10/01/23.   Her RLE is about 1 inch large than the LLE at the kne and at the calf.   She does have a h/o DVT.  However, her only pain is in the R knee with WB.   She has some crepitus with AROM, but no more than the L knee.   Patient has deficits in R knee ROM, R LE flexibility, RLE strength, and pain mainly in the R knee which are interfering with ADLs and are impacting quality of life.  On LEFS patient scored 11/80 demonstrating severe functional limitation.  Nyasiah will benefit from skilled PT to address above deficits to improve mobility and activity tolerance with decreased pain interference.  OBJECTIVE IMPAIRMENTS: Abnormal gait, decreased ROM, decreased strength, and pain.   ACTIVITY LIMITATIONS: carrying, lifting, bending, standing, squatting, stairs, and locomotion level  PARTICIPATION LIMITATIONS: cleaning, laundry, shopping, community activity, and occupation  PERSONAL FACTORS: Age, Time since onset of injury/illness/exacerbation, and 1-2 comorbidities: HTN,h/o RLE DVT, near syncope  are also affecting patient's functional outcome.   REHAB POTENTIAL: Good  CLINICAL DECISION MAKING: Evolving/moderate complexity  EVALUATION COMPLEXITY: Moderate   GOALS: Goals reviewed with patient? Yes  SHORT TERM GOALS: Target date: 11/16/2023   Patient will be independent with initial HEP. Baseline: 100% PT assist required for correct completion Goal status: IN PROGRESS- 10/26/23 reports compliance  2.  Patient will report at least 25% improvement in R knee pain to improve QOL. Baseline: 7/10 worst Goal status: MET- 11/02/23   LONG TERM GOALS: Target date: 12/14/2023   Patient will be independent with  advanced/ongoing HEP to improve outcomes and carryover.  Baseline: no advanced HEP yet Goal status: INITIAL  2.  Patient will report at least 50-75% improvement in R knee pain to improve QOL. Baseline: 7/10  worst Goal status:  IN PROGRESS- 11/02/23   3.  Patient will demonstrate improved R knee AROM to >/= 0-125 deg to allow for normal gait and stair mechanics. Baseline: Refer to above LE ROM table Goal status: INITIAL  4.  Patient will demonstrate improved RLE strength to >/= 5/5 for improved stability and ease of mobility. Baseline: Refer to above LE MMT table Goal status: IN PROGRESS- 11/09/23  5.  Patient will be able to ambulate 600' with LRAD and normal gait pattern without increased pain to access community.  Baseline: pain with any ambulation Goal status: INITIAL  6. Patient will be able to ascend/descend stairs with 1 HR and reciprocal step pattern safely to access home and community.  Baseline: unable to go reciprocally due to pain Goal status: INITIAL  7.  Patient will report >/= 30/80 on LEFS (MCID = 9 pts) to demonstrate improved functional ability. Baseline: 11 Goal status: INITIAL  8.  Patient will demonstrate at least 19/24 on DGI to decrease risk of falls. Baseline: TBD Goal status: INITIAL   9.  Patient will improve on TUG score to </= 18 sec to reduce fall risk and function normally in the community Baseline: 38.37 Goal status: IN PROGRESS- 11/09/23   10.  Patient will improve on 5X sit to stand test to </= 20 sec to have functional R knee strength   Baseline:  43 sec   Goal Status:  11/09/23 met  PLAN:  PT FREQUENCY: 1-2x/week  PT DURATION: 8 weeks  PLANNED INTERVENTIONS: 97164- PT Re-evaluation, 97750- Physical Performance Testing, 97110-Therapeutic exercises, 97530- Therapeutic activity, 97112- Neuromuscular re-education, 97535- Self Care, 02859- Manual therapy, [phone]- Gait training, [phone]- Electrical stimulation (unattended), 97016- Vasopneumatic  device, 97035- Ultrasound, 02966- Ionotophoresis 4mg /ml Dexamethasone, Patient/Family education, Balance training, Stair training, Taping, Joint mobilization, Cryotherapy, and Moist heat  PLAN FOR NEXT SESSION: LE Strengthening and balance activities   Sol LITTIE Gaskins, PTA 11/09/2023, 10:21 AM  Date of referral: see referral Referring provider: Benjamine Primmer MD Referring diagnosis? R knee and R leg pain Treatment diagnosis? (if different than referring diagnosis) R knee pain, Gait abnormality, muscle weakness, R knee stiffness  What was this (referring dx) caused by? Other: unknown cause  Nature of Condition: Initial Onset (within last 3 months)   Laterality: Rt  Current Functional Measure Score: LEFS 11/80  Objective measurements identify impairments when they are compared to normal values, the uninvolved extremity, and prior level of function.  [x]  Yes  []  No  Objective assessment of functional ability: Severe functional limitations   Briefly describe symptoms: Patient is an 89 y/o referred to PT from PCP for R knee and RLE pain.   Patient is accompanied by her dtr  today who gives a more detailed history as patient has some difficulty providing.   Dtr reports Acute onset right lower leg pain today after a car trip of several hours; began around noon.  States they went to a funeral in the [place], [place] area and that when they stopped for a restroom break the patient had R knee pain as soon as she got out of the car.   They deny any twisting motion or fall.   Patient denies any prior falls.   States has never had any R knee pain in the past.   States pain continued for the duration of the day and only continued to worsen.   She was not wearing high heels and denies rolling her ankle, etc.  States pain just started for no apparent reason.  Never had it before.  She came to the ED here when she arrived home and had an Xray and D-dimer that were negative (no US  since no US  tech was  available).  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  She has continued to have pain for the last 3 weeks that has severely limited her.   She saw her PCP recently and was referred to PT.   Patient is normally very active for 89 y/o.   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   However, she is using a cane on arrival to the clinic and is very unsteady, limping heavily on the RLE   How did symptoms start: insidiously  Average pain intensity:  Last 24 hours: 8/10 worst with WB RLE  Past week: same  How often does the pt experience symptoms? Frequently  How much have the symptoms interfered with usual daily activities? Quite a bit  How has condition changed since care began at this facility? A little worse  In general, how is the patients overall health? Very Good   BACK PAIN (STarT Back Screening Tool) No

## 2023-11-16 ENCOUNTER — Ambulatory Visit: Admitting: Rehabilitation

## 2023-11-16 ENCOUNTER — Encounter: Payer: Self-pay | Admitting: Rehabilitation

## 2023-11-16 DIAGNOSIS — R2689 Other abnormalities of gait and mobility: Secondary | ICD-10-CM

## 2023-11-16 DIAGNOSIS — M6281 Muscle weakness (generalized): Secondary | ICD-10-CM

## 2023-11-16 DIAGNOSIS — M25561 Pain in right knee: Secondary | ICD-10-CM | POA: Diagnosis not present

## 2023-11-16 DIAGNOSIS — M25661 Stiffness of right knee, not elsewhere classified: Secondary | ICD-10-CM

## 2023-11-16 NOTE — Therapy (Signed)
 [name] LOWER [facility]TREMITY TREATMENT   Patient Name: Kelly Dodson MRN: [id number] DOB:1934-05-11, 89 y.o., female Today's Date: 11/16/2023   END OF SESSION:  PT End of Session - 11/16/23 0947     Visit Number 5    Date for Recertification  12/14/23    PT Start Time 0942    PT Stop Time 1030    PT Time Calculation (min) 48 min    Activity Tolerance Patient tolerated treatment well;No increased pain    Behavior During Therapy WFL for tasks assessed/performed            Past Medical History:  Diagnosis Date   Hypercholesteremia    Hypertension    History reviewed. No pertinent surgical history. Patient Active Problem List   Diagnosis Date Noted   Near syncope 10/31/2022    PCP: Benjamine Aland, MD   REFERRING PROVIDER: Benjamine Aland, MD   REFERRING DIAG: [phone] (ICD-10-CM) - Pain in right knee M79.661 (ICD-10-CM) - Pain in right lower leg  THERAPY DIAG:  Acute pain of right knee  Other abnormalities of gait and mobility  Stiffness of right knee, not elsewhere classified  Muscle weakness (generalized)  RATIONALE FOR EVALUATION AND TREATMENT: Rehabilitation  ONSET DATE: 10/01/23  NEXT MD VISIT:    SUBJECTIVE:                                                                                                                                                                                                         SUBJECTIVE STATEMENT:   Patient reports she is getting better since initial eval.   She mainly c/o R knee pain in the AM, but admits she is not taking her Tylenol  regularly.  She is encouraged to take it as directed by her MD and if it is PRN, then take it in the AM for the pain.  Rates pain today 2/10 R knee today  EVAL:  Patient is an 89 y/o referred to PT from PCP for R knee and RLE pain.   Patient is accompanied by her dtr  today who gives a more detailed history as patient has some difficulty providing.   Dtr reports Acute onset right  lower leg pain today after a car trip of several hours; began around noon.  States they went to a funeral in the [place], [place] area and that when they stopped for a restroom break the patient had R knee pain as soon as she got out of the car.   They deny any twisting motion or fall.   Patient  denies any prior falls.   States has never had any R knee pain in the past.   States pain continued for the duration of the day and only continued to worsen.   She was not wearing high heels and denies rolling her ankle, etc.  States pain just started for no apparent reason.  Never had it before.  She came to the ED here when she arrived home and had an Xray and D-dimer that were negative (no US  since no US  tech was available).  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  She has continued to have pain for the last 3 weeks that has severely limited her.   She saw her PCP recently and was referred to PT.   Patient is normally very active for 89 y/o.   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   However, she is using a cane on arrival to the clinic and is very unsteady, limping heavily on the RLE  PAIN: Are you having pain? Yes: NPRS scale: 0/10 rest;  5/10 with WB positions Pain location: L knee Pain description: sharp, stabbing, aching Aggravating factors: walking Relieving factors: NWB positions with leg elevation  PERTINENT HISTORY:  h/o DVT RLE, HTN, hyperlipidemia, near syncope,   PRECAUTIONS: None  RED FLAGS: None  WEIGHT BEARING RESTRICTIONS: No  FALLS:  Has patient fallen in last 6 months? No  LIVING ENVIRONMENT: Lives with: lives alone Lives in: House/apartment Stairs: Yes: External: 1 steps; none Has following equipment at home: Single point cane  OCCUPATION: retired from driving school bus;  still works as a Chief Strategy Officer  PLOF: Independent with gait  PATIENT GOALS: not hurt in my R knee   OBJECTIVE:  (objective measures completed at initial evaluation unless otherwise dated)  DIAGNOSTIC FINDINGS:  CLINICAL DATA:  Right lower leg pain.   EXAM: RIGHT TIBIA AND FIBULA - 2 VIEW   COMPARISON:  None Available.   FINDINGS: No acute bony abnormality. Specifically, no fracture, subluxation, or dislocation. No joint effusion in the right knee. Scattered soft tissue calcifications.   IMPRESSION: No acute bony abnormality.     Electronically Signed   By: Franky Crease M.D.   On: 10/01/2023 23:06  PATIENT SURVEYS:   Extreme difficulty/unable (0), Quite a bit of difficulty (1), Moderate difficulty (2), Little difficulty (3), No difficulty (4) Survey date:  11/16/2023  11/16/2023   Any of your usual work, housework or school activities 1 2  2. Usual hobbies, recreational or sporting activities 1 2  3. Getting into/out of the bath 1 2  4. Walking between rooms 1 2  5. Putting on socks/shoes 1 2  6. Squatting  1 1  7. Lifting an object, like a bag of groceries from the floor 1 1  8. Performing light activities around your home 1 2  9. Performing heavy activities around your home 0 0  10. Getting into/out of a car 1 2  11. Walking 2 blocks 0 2  12. Walking 1 mile 0 0  13. Going up/down 10 stairs (1 flight) 0 2  14. Standing for 1 hour 0 0  15.  sitting for 1 hour 1 4  16. Running on even ground 0 0  17. Running on uneven ground 0 0  18. Making sharp turns while running fast 0 0  19. Hopping  0 0  20. Rolling over in bed 1 2  Score  total:  11 26    COGNITION: Overall cognitive status: Within functional limits for tasks assessed    SENSATION: WFL  EDEMA:  Circumferential:    Patellar:  LLE = 39 cm;    RLE = 41.5 cm  Mid calf LLE = 35 cm;  RLE = 37.5 cm   POSTURE:  No Significant postural limitations  PALPATION: Somewhat TTP around the medial joint lines  MUSCLE LENGTH: Hamstrings: Right SLR = 70 deg; Left SLR = 80 deg Hamstrings: mild tightness ITB: mild  tight Piriformis: mild tight  LOWER EXTREMITY ROM:  Active ROM Right eval Left eval  Hip flexion    Hip extension    Hip abduction    Hip adduction    Hip internal rotation    Hip external rotation    Knee flexion 111 132  Knee extension -4 0  Ankle dorsiflexion    Ankle plantarflexion    Ankle inversion    Ankle eversion     Blank rows not tested)  LOWER EXTREMITY MMT:  MMT Right eval Left eval R/L 11/09/23  Hip flexion 3+ 4- 4/4+  Hip extension     Hip abduction     Hip adduction     Hip internal rotation 4- 4+ 4/4+  Hip external rotation 4+ 4+ 4+/4+  Knee flexion 4 5 5/5  Knee extension 4+ 5 5/5  Ankle dorsiflexion 5 5 5/4+  Ankle plantarflexion 4 4   Ankle inversion     Ankle eversion      (Blank rows = not tested)  LOWER EXTREMITY SPECIAL TESTS:  Knee special tests: Anterior drawer test: negative, Posterior drawer test: negative, Lachman Test: negative, and Pivot shift test: negative  FUNCTIONAL TESTS:  5 times sit to stand: 43.91 sec Timed up and go (TUG): 38.37  GAIT: Distance walked: into clinic x 200' Assistive device utilized: Single point cane Level of assistance: Min A Gait pattern: severe limp RLE, holds cane in the RUE incorrectly, needs min assist to maintain balance, holding walls with LUE coming into clinic today, very unsteady and unsafe;  high fall risk Comments:    TODAY'S TREATMENT:  11/16/23 THERAPEUTIC EXERCISE: To improve ROM.  Demonstration, verbal and tactile cues throughout for technique. NuStep L4 x 7'  THERAPEUTIC ACTIVITIES: To improve functional performance.  Demonstration, verbal and tactile cues throughout for technique. Resisted sidestepping RTB x 4 laps at counter Seated knee flexion RTB x 2/10 RLE Seated knee extension 3# w/ 2-3 sec holds x 20 RLE Forward lunge at counter x 2/10 RLE Rechecked LEFS  NEUROMUSCULAR RE-EDUCATION: To improve balance, coordination, and proprioception. Corner balance: NO UE support!  Toe  raises x 20  Heel raises x 20  Minisquat x 20  March x 20 BLE  Tandem stance x 1' ea way  SLS x 10-15 sec x 4 trials each leg w/ CGA to min assist variably   11/09/23 Nustep L6x3min UELE LE strength test TUG- 20 seconds no AD- got confused with instructions toward the end 5xSTS- 15.66 sec Standing marching x 10 BLE Standing hip abduction x 10 BLE- cues for posture Standing hip extension x 10 BLE Counter squat x 10   PATIENT EDUCATION:  Education details: HEP update--added mini lunge holding counter today Person educated: Patient Education method: Programmer, multimedia, Demonstration, Verbal cues, Tactile cues, and Handouts Education comprehension: verbalized understanding, verbal cues required, tactile cues required, and needs further education  HOME EXERCISE PROGRAM: Access Code: [email or web address] URL: [email or web address] Date: 11/16/2023 Prepared by: Garnette Montclair  Exercises - Supine Ankle Pumps  - 1 x daily - 7 x weekly - 3 sets - 10 reps - Supine Heel Slides  - 1 x daily - 7 x weekly - 3 sets - 10 reps - Supine Short Arc Quad  - 1 x daily - 7 x weekly - 3 sets - 10 reps - Supine Straight Leg Raises  - 1 x daily - 7 x weekly - 1-2 sets - 10 reps - Standing Ankle Dorsiflexion with Table Support  - 1 x daily - 7 x weekly - 3 sets - 10 reps - Seated Hip Adduction Isometrics with Ball  - 1 x daily - 7 x weekly - 3 sets - 10 reps - Seated Hip Abduction with Resistance  - 1 x daily - 7 x weekly - 3 sets - 10 reps - Standing Heel Raises  - 1 x daily - 7 x weekly - 3 sets - 10 reps - Mini Squat with Counter Support  - 1 x daily - 7 x weekly - 3 sets - 10 reps - Forward Lunge with Back Leg Straight and Counter Support  - 1 x daily - 7 x weekly - 2 sets - 10 reps  ASSESSMENT:  CLINICAL IMPRESSION: Patient is making good progress and LEFS score has more than doubled since initial eval.   She is able to ambulate with a cane today in clinic with much improved balance.   She  admits she is not using her walker at home, but always uses it to go out which is best for her.   We continue to recommend community walker use.  She still has weakness and balance deficits from the R knee pain and requires skilled PT treatments to address these deficits.   She has good rehab potential to improve.   Advised her to speak with her daughter about getting her a pedaler for home use.   Continue per POC  Kelly Dodson is a 89 y.o. female who was referred to physical therapy for evaluation and treatment for R knee and lower leg pain.    Patient reports onset of R knee pain beginning 10/01/23 during a car ride to [place], [place] to go to a funeral.   When she got out for a restroom break she began having R knee pain for no apparent reason.   No h/o trauma, fall, twist, etc.    Very high functioning patient.  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 Pain is worse with any WB position on the RLE.  She gets full relief when she sits down and rests.  The pain has really not gotten a great deal better since 10/01/23.   Her RLE is about 1 inch large than the LLE at the kne and at the calf.   She does have a h/o DVT.  However, her only pain is in the R knee with WB.   She has some crepitus with AROM, but no more than the L knee.   Patient has deficits in R knee ROM, R LE flexibility, RLE strength, and pain mainly in the R knee which are interfering with ADLs and are impacting quality of life.  On LEFS patient scored 11/80 demonstrating severe functional limitation.  Keita will benefit from skilled PT to address above deficits to improve mobility and activity tolerance with decreased pain interference.  OBJECTIVE IMPAIRMENTS: Abnormal gait, decreased ROM, decreased strength, and pain.   ACTIVITY LIMITATIONS: carrying, lifting, bending,  standing, squatting, stairs, and locomotion level  PARTICIPATION LIMITATIONS: cleaning, laundry, shopping, community activity, and  occupation  PERSONAL FACTORS: Age, Time since onset of injury/illness/exacerbation, and 1-2 comorbidities: HTN,h/o RLE DVT, near syncope  are also affecting patient's functional outcome.   REHAB POTENTIAL: Good  CLINICAL DECISION MAKING: Evolving/moderate complexity  EVALUATION COMPLEXITY: Moderate   GOALS: Goals reviewed with patient? Yes  SHORT TERM GOALS: Target date: 11/16/2023   Patient will be independent with initial HEP. Baseline: 100% PT assist required for correct completion Goal status: IN PROGRESS- 10/26/23 reports compliance  2.  Patient will report at least 25% improvement in R knee pain to improve QOL. Baseline: 7/10 worst Goal status: MET- 11/02/23   LONG TERM GOALS: Target date: 12/14/2023   Patient will be independent with advanced/ongoing HEP to improve outcomes and carryover.  Baseline: no advanced HEP yet 11/16/23:  progressing Goal status: INITIAL  2.  Patient will report at least 50-75% improvement in R knee pain to improve QOL. Baseline: 7/10  worst Goal status: IN PROGRESS- 11/02/23   3.  Patient will demonstrate improved R knee AROM to >/= 0-125 deg to allow for normal gait and stair mechanics. Baseline: Refer to above LE ROM table Goal status: INITIAL  4.  Patient will demonstrate improved RLE strength to >/= 5/5 for improved stability and ease of mobility. Baseline: Refer to above LE MMT table Goal status: IN PROGRESS- 11/09/23  5.  Patient will be able to ambulate 600' with LRAD and normal gait pattern without increased pain to access community.  Baseline: pain with any ambulation Goal status: INITIAL  6. Patient will be able to ascend/descend stairs with 1 HR and reciprocal step pattern safely to access home and community.  Baseline: unable to go reciprocally due to pain Goal status: INITIAL  7.  Patient will report >/= 30/80 on LEFS (MCID = 9 pts) to demonstrate improved functional ability. Baseline: 11 11/16/23:  26/80 Goal status:  IN PROGRESS  8.  Patient will demonstrate at least 19/24 on DGI to decrease risk of falls. Baseline: TBD Goal status: INITIAL   9.  Patient will improve on TUG score to </= 18 sec to reduce fall risk and function normally in the community Baseline: 38.37 Goal status: IN PROGRESS- 11/09/23   10.  Patient will improve on 5X sit to stand test to </= 20 sec to have functional R knee strength   Baseline:  43 sec   Goal Status:  11/09/23 met  PLAN:  PT FREQUENCY: 1-2x/week  PT DURATION: 8 weeks  PLANNED INTERVENTIONS: 97164- PT Re-evaluation, 97750- Physical Performance Testing, 97110-Therapeutic exercises, 97530- Therapeutic activity, [id number]- Neuromuscular re-education, 97535- Self Care, [id number]- Manual therapy, [id number]- Gait training, [phone]- Electrical stimulation (unattended), 97016- Vasopneumatic device, 97035- Ultrasound, 02966- Ionotophoresis 4mg /ml Dexamethasone, Patient/Family education, Balance training, Stair training, Taping, Joint mobilization, Cryotherapy, and Moist heat  PLAN FOR NEXT SESSION: do steps; check ROM; review lunges and corner balance;  continue strengthening R quads/hams/hip muscles   Syriah Delisi, PT 11/16/2023, 10:57 AM  Date of referral: see referral Referring provider: Benjamine Primmer MD Referring diagnosis? R knee and R leg pain Treatment diagnosis? (if different than referring diagnosis) R knee pain, Gait abnormality, muscle weakness, R knee stiffness  What was this (referring dx) caused by? Other: unknown cause  Nature of Condition: Initial Onset (within last 3 months)   Laterality: Rt  Current Functional Measure Score: LEFS 11/80  Objective measurements identify impairments when they are compared to normal values, the uninvolved extremity, and  prior level of function.  [x]  Yes  []  No  Objective assessment of functional ability: Severe functional limitations   Briefly describe symptoms: Patient is an 89 y/o referred to PT from PCP for R knee and  RLE pain.   Patient is accompanied by her dtr  today who gives a more detailed history as patient has some difficulty providing.   Dtr reports Acute onset right lower leg pain today after a car trip of several hours; began around noon.  States they went to a funeral in the [place], [place] area and that when they stopped for a restroom break the patient had R knee pain as soon as she got out of the car.   They deny any twisting motion or fall.   Patient denies any prior falls.   States has never had any R knee pain in the past.   States pain continued for the duration of the day and only continued to worsen.   She was not wearing high heels and denies rolling her ankle, etc.  States pain just started for no apparent reason.  Never had it before.  She came to the ED here when she arrived home and had an Xray and D-dimer that were negative (no US  since no US  tech was available).  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  She has continued to have pain for the last 3 weeks that has severely limited her.   She saw her PCP recently and was referred to PT.   Patient is normally very active for 89 y/o.   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   However, she is using a cane on arrival to the clinic and is very unsteady, limping heavily on the RLE   How did symptoms start: insidiously  Average pain intensity:  Last 24 hours: 8/10 worst with WB RLE  Past week: same  How often does the pt experience symptoms? Frequently  How much have the symptoms interfered with usual daily activities? Quite a bit  How has condition changed since care began at this facility? A little worse  In general, how is the patients overall health? Very Good   BACK PAIN (STarT Back Screening Tool) No

## 2023-11-23 ENCOUNTER — Encounter: Payer: Self-pay | Admitting: Rehabilitation

## 2023-11-23 ENCOUNTER — Ambulatory Visit: Admitting: Rehabilitation

## 2023-11-23 DIAGNOSIS — M25661 Stiffness of right knee, not elsewhere classified: Secondary | ICD-10-CM

## 2023-11-23 DIAGNOSIS — M6281 Muscle weakness (generalized): Secondary | ICD-10-CM

## 2023-11-23 DIAGNOSIS — M25561 Pain in right knee: Secondary | ICD-10-CM | POA: Diagnosis not present

## 2023-11-23 DIAGNOSIS — R2689 Other abnormalities of gait and mobility: Secondary | ICD-10-CM

## 2023-11-23 NOTE — Therapy (Addendum)
 [name] LOWER EXTREMITY TREATMENT   Patient Name: Kelly Dodson MRN: [id number] DOB:Mar 28, 1934, 89 y.o., female Today's Date: 11/23/2023   END OF SESSION:  PT End of Session - 11/23/23 1131     Visit Number 6    Date for Recertification  12/14/23    PT Start Time 0935    PT Stop Time 1014    PT Time Calculation (min) 39 min    Activity Tolerance Patient tolerated treatment well;No increased pain    Behavior During Therapy WFL for tasks assessed/performed             Past Medical History:  Diagnosis Date   Hypercholesteremia    Hypertension    History reviewed. No pertinent surgical history. Patient Active Problem List   Diagnosis Date Noted   Near syncope 10/31/2022    PCP: Benjamine Aland, MD   REFERRING PROVIDER: Benjamine Aland, MD   REFERRING DIAG: [phone] (ICD-10-CM) - Pain in right knee M79.661 (ICD-10-CM) - Pain in right lower leg  THERAPY DIAG:  Acute pain of right knee  Other abnormalities of gait and mobility  Stiffness of right knee, not elsewhere classified  Muscle weakness (generalized)  RATIONALE FOR EVALUATION AND TREATMENT: Rehabilitation  ONSET DATE: 10/01/23  NEXT MD VISIT:    SUBJECTIVE:                                                                                                                                                                                                         SUBJECTIVE STATEMENT:   Patient reports she is getting better since initial eval.   She mainly c/o R knee pain in the AM, but admits she is not taking her Tylenol  regularly.  She is encouraged to take it as directed by her MD and if it is PRN, then take it in the AM for the pain.  Rates pain today 2/10 R knee today  EVAL:  Patient is an 89 y/o referred to PT from PCP for R knee and RLE pain.   Patient is accompanied by her dtr  today who gives a more detailed history as patient has some difficulty providing.   Dtr reports Acute onset right  lower leg pain today after a car trip of several hours; began around noon.  States they went to a funeral in the [place], [place] area and that when they stopped for a restroom break the patient had R knee pain as soon as she got out of the car.   They deny any twisting motion or fall.  Patient denies any prior falls.   States has never had any R knee pain in the past.   States pain continued for the duration of the day and only continued to worsen.   She was not wearing high heels and denies rolling her ankle, etc.  States pain just started for no apparent reason.  Never had it before.  She came to the ED here when she arrived home and had an Xray and D-dimer that were negative (no US  since no US  tech was available).  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  She has continued to have pain for the last 3 weeks that has severely limited her.   She saw her PCP recently and was referred to PT.   Patient is normally very active for 89 y/o.   She still works as an camera operator at [employer] and has never had any problem ambulating.   However, she is using a cane on arrival to the clinic and is very unsteady, limping heavily on the RLE  PAIN: Are you having pain? Yes: NPRS scale: 0/10 rest;  5/10 with WB positions Pain location: L knee Pain description: sharp, stabbing, aching Aggravating factors: walking Relieving factors: NWB positions with leg elevation  PERTINENT HISTORY:  h/o DVT RLE, HTN, hyperlipidemia, near syncope,   PRECAUTIONS: None  RED FLAGS: None  WEIGHT BEARING RESTRICTIONS: No  FALLS:  Has patient fallen in last 6 months? No  LIVING ENVIRONMENT: Lives with: lives alone Lives in: House/apartment Stairs: Yes: External: 1 steps; none Has following equipment at home: Single point cane  OCCUPATION: retired from driving school bus;  still works as a chief strategy officer  PLOF: Independent with gait  PATIENT GOALS: not hurt in my R knee   OBJECTIVE:  (objective measures completed at initial evaluation unless otherwise dated)  DIAGNOSTIC FINDINGS:  CLINICAL DATA:  Right lower leg pain.   EXAM: RIGHT TIBIA AND FIBULA - 2 VIEW   COMPARISON:  None Available.   FINDINGS: No acute bony abnormality. Specifically, no fracture, subluxation, or dislocation. No joint effusion in the right knee. Scattered soft tissue calcifications.   IMPRESSION: No acute bony abnormality.     Electronically Signed   By: Franky Crease M.D.   On: 10/01/2023 23:06  PATIENT SURVEYS:   Extreme difficulty/unable (0), Quite a bit of difficulty (1), Moderate difficulty (2), Little difficulty (3), No difficulty (4) Survey date:  11/23/2023  11/23/2023   Any of your usual work, housework or school activities 1 2  2. Usual hobbies, recreational or sporting activities 1 2  3. Getting into/out of the bath 1 2  4. Walking between rooms 1 2  5. Putting on socks/shoes 1 2  6. Squatting  1 1  7. Lifting an object, like a bag of groceries from the floor 1 1  8. Performing light activities around your home 1 2  9. Performing heavy activities around your home 0 0  10. Getting into/out of a car 1 2  11. Walking 2 blocks 0 2  12. Walking 1 mile 0 0  13. Going up/down 10 stairs (1 flight) 0 2  14. Standing for 1 hour 0 0  15.  sitting for 1 hour 1 4  16. Running on even ground 0 0  17. Running on uneven ground 0 0  18. Making sharp turns while running fast 0 0  19. Hopping  0 0  20. Rolling over in bed 1 2  Score total:  11 26    COGNITION: Overall cognitive status: Within functional limits for tasks assessed    SENSATION: WFL  EDEMA:  Circumferential:    Patellar:  LLE = 39 cm;    RLE = 41.5 cm  Mid calf LLE = 35 cm;  RLE = 37.5 cm   POSTURE:  No Significant postural limitations  PALPATION: Somewhat TTP around the medial joint lines  MUSCLE LENGTH: Hamstrings: Right SLR = 70 deg; Left SLR = 80 deg Hamstrings: mild tightness ITB: mild  tight Piriformis: mild tight  LOWER EXTREMITY ROM:  Active ROM Right eval Left eval RLE 11/23/23  Hip flexion     Hip extension     Hip abduction     Hip adduction     Hip internal rotation     Hip external rotation     Knee flexion 111 132 120  Knee extension -4 0 -2  Ankle dorsiflexion     Ankle plantarflexion     Ankle inversion     Ankle eversion      Blank rows not tested)  LOWER EXTREMITY MMT:  MMT Right eval Left eval R/L 11/09/23  Hip flexion 3+ 4- 4/4+  Hip extension     Hip abduction     Hip adduction     Hip internal rotation 4- 4+ 4/4+  Hip external rotation 4+ 4+ 4+/4+  Knee flexion 4 5 5/5  Knee extension 4+ 5 5/5  Ankle dorsiflexion 5 5 5/4+  Ankle plantarflexion 4 4   Ankle inversion     Ankle eversion      (Blank rows = not tested)  LOWER EXTREMITY SPECIAL TESTS:  Knee special tests: Anterior drawer test: negative, Posterior drawer test: negative, Lachman Test: negative, and Pivot shift test: negative  FUNCTIONAL TESTS:  5 times sit to stand: 43.91 sec Timed up and go (TUG): 38.37  GAIT: Distance walked: into clinic x 200' Assistive device utilized: Single point cane Level of assistance: Min A Gait pattern: severe limp RLE, holds cane in the RUE incorrectly, needs min assist to maintain balance, holding walls with LUE coming into clinic today, very unsteady and unsafe;  high fall risk Comments:    TODAY'S TREATMENT:  11/23/23 THERAPEUTIC EXERCISE: To improve ROM.  Demonstration, verbal and tactile cues throughout for technique. NuStep L4 x 6'  NEUROMUSCULAR RE-EDUCATION: To improve balance. IN CORNER/CHAIR IN FRONT:  Toe raises x 20    Heel raises x 20  Minisquat x 20   Marching x 20 BLE Step and Lunge at counter x 2/10 RLE with facilitation for posture/balance/amount of knee flexion  THERAPEUTIC ACTIVITIES: To improve functional performance.  Demonstration, verbal and tactile cues throughout for technique. Up/down 1 flight of steps  with R handrail reciprocal pattern for ascension;  intermittent reciprocal descending with CGA/SBA for foot placement and  eccentric knee control and balance Supine SLR x 2/10 RLE Supine QS x 20 RLE Supine HS x 20 RLE   11/16/23 THERAPEUTIC EXERCISE: To improve ROM.  Demonstration, verbal and tactile cues throughout for technique. NuStep L4 x 7'  THERAPEUTIC ACTIVITIES: To improve functional performance.  Demonstration, verbal and tactile cues throughout for technique. Resisted sidestepping RTB x 4 laps at counter Seated knee flexion RTB x 2/10 RLE Seated knee extension 3# w/ 2-3 sec holds x 20 RLE Forward lunge at counter x 2/10 RLE Rechecked LEFS  NEUROMUSCULAR RE-EDUCATION: To improve balance, coordination, and proprioception. Corner balance: NO UE support!  Toe raises x 20  Heel raises x 20  Minisquat x 20  March x 20 BLE  Tandem stance x 1' ea way  SLS x 10-15 sec x 4 trials each leg w/ CGA to min assist variably   11/09/23 Nustep L6x82min UELE LE strength test TUG- 20 seconds no AD- got confused with instructions toward the end 5xSTS- 15.66 sec Standing marching x 10 BLE Standing hip abduction x 10 BLE- cues for posture Standing hip extension x 10 BLE Counter squat x 10   PATIENT EDUCATION:  Education details: HEP update--added mini lunge holding counter today Person educated: Patient Education method: Programmer, Multimedia, Demonstration, Verbal cues, Tactile cues, and Handouts Education comprehension: verbalized understanding, verbal cues required, tactile cues required, and needs further education  HOME EXERCISE PROGRAM: Access Code: [email or web address] URL: [email or web address] Date: 11/16/2023 Prepared by: Garnette Montclair  Exercises - Supine Ankle Pumps  - 1 x daily - 7 x weekly - 3 sets - 10 reps - Supine Heel Slides  - 1 x daily - 7 x weekly - 3 sets - 10 reps - Supine Short Arc Quad  - 1 x daily - 7 x weekly - 3 sets - 10 reps - Supine Straight Leg  Raises  - 1 x daily - 7 x weekly - 1-2 sets - 10 reps - Standing Ankle Dorsiflexion with Table Support  - 1 x daily - 7 x weekly - 3 sets - 10 reps - Seated Hip Adduction Isometrics with Ball  - 1 x daily - 7 x weekly - 3 sets - 10 reps - Seated Hip Abduction with Resistance  - 1 x daily - 7 x weekly - 3 sets - 10 reps - Standing Heel Raises  - 1 x daily - 7 x weekly - 3 sets - 10 reps - Mini Squat with Counter Support  - 1 x daily - 7 x weekly - 3 sets - 10 reps - Forward Lunge with Back Leg Straight and Counter Support  - 1 x daily - 7 x weekly - 2 sets - 10 reps  ASSESSMENT:  CLINICAL IMPRESSION: Reviewed HEP today and provided written handouts again since patient isn't sure of what to do at home.   She can recall 2 exercises but no others.   Dispensed a walker bag with her HEP in it.   After review she is able to return demo her HEP correctly.   Memory is a barrier to progress, but she is still doing better than on initial visit.    She has not had any falls and her R knee pain is better per her report.   However, there is still weakness noted with lunging forward on the R knee.   R knee ROM has improved by 10 degrees.    PT remains necessary for R knee pain, strength, HEP deficits.    Kelly Dodson is a 89 y.o. female who was referred to physical therapy for evaluation and treatment for R knee and lower leg pain.    Patient reports onset of R knee pain beginning 10/01/23 during a car ride to [place], [place] to go to a funeral.   When she got out for a restroom break she began having R knee pain for no apparent reason.   No h/o trauma, fall, twist, etc.    Very high functioning patient.  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 Pain is worse with any WB position on the RLE.  She gets full relief when she  sits down and rests.  The pain has really not gotten a great deal better since 10/01/23.   Her RLE is about 1 inch large than the LLE at the kne and at the calf.   She does  have a h/o DVT.  However, her only pain is in the R knee with WB.   She has some crepitus with AROM, but no more than the L knee.   Patient has deficits in R knee ROM, R LE flexibility, RLE strength, and pain mainly in the R knee which are interfering with ADLs and are impacting quality of life.  On LEFS patient scored 11/80 demonstrating severe functional limitation.  [place] will benefit from skilled PT to address above deficits to improve mobility and activity tolerance with decreased pain interference.  OBJECTIVE IMPAIRMENTS: Abnormal gait, decreased ROM, decreased strength, and pain.   ACTIVITY LIMITATIONS: carrying, lifting, bending, standing, squatting, stairs, and locomotion level  PARTICIPATION LIMITATIONS: cleaning, laundry, shopping, community activity, and occupation  PERSONAL FACTORS: Age, Time since onset of injury/illness/exacerbation, and 1-2 comorbidities: HTN,h/o RLE DVT, near syncope  are also affecting patient's functional outcome.   REHAB POTENTIAL: Good  CLINICAL DECISION MAKING: Evolving/moderate complexity  EVALUATION COMPLEXITY: Moderate   GOALS: Goals reviewed with patient? Yes  SHORT TERM GOALS: Target date: 11/16/2023   Patient will be independent with initial HEP. Baseline: 100% PT assist required for correct completion Goal status: MET- 11/23/23--able to teach back after review today  2.  Patient will report at least 25% improvement in R knee pain to improve QOL. Baseline: 7/10 worst Goal status: MET- 11/02/23   LONG TERM GOALS: Target date: 12/14/2023   Patient will be independent with advanced/ongoing HEP to improve outcomes and carryover.  Baseline: no advanced HEP yet 11/16/23:  progressing Goal status: IN PROGRESS  2.  Patient will report at least 50-75% improvement in R knee pain to improve QOL. Baseline: 7/10  worst Goal status: IN PROGRESS- 11/02/23   3.  Patient will demonstrate improved R knee AROM to >/= 0-125 deg to allow for normal  gait and stair mechanics. Baseline: Refer to above LE ROM table 11/23/23:  -2 to 120 degrees Goal status: IN PROGRESS  4.  Patient will demonstrate improved RLE strength to >/= 5/5 for improved stability and ease of mobility. Baseline: Refer to above LE MMT table Goal status: IN PROGRESS- 11/09/23  5.  Patient will be able to ambulate 600' with LRAD and normal gait pattern without increased pain to access community.  Baseline: pain with any ambulation 11/23/23:  less pain in the R knee per her subjective report Goal status: IN PROGRESS  6. Patient will be able to ascend/descend stairs with 1 HR and reciprocal step pattern safely to access home and community.  Baseline: unable to go reciprocally due to pain 11/23/23:  able to go up steps with reciprocal pattern w/ R handrail.   Able to descend reciprocally about 30% of the time on a flight Goal status: IN PROGRESS  7.  Patient will report >/= 30/80 on LEFS (MCID = 9 pts) to demonstrate improved functional ability. Baseline: 11 11/16/23:  26/80 Goal status: IN PROGRESS  8.  Patient will demonstrate at least 19/24 on DGI to decrease risk of falls. Baseline: TBD Goal status: INITIAL   9.  Patient will improve on TUG score to </= 18 sec to reduce fall risk and function normally in the community Baseline: 38.37 Goal status: IN PROGRESS- 11/09/23   10.  Patient will improve  on 5X sit to stand test to </= 20 sec to have functional R knee strength   Baseline:  43 sec   Goal Status:  11/09/23 met  PLAN:  PT FREQUENCY: 1-2x/week  PT DURATION: 8 weeks  PLANNED INTERVENTIONS: 97164- PT Re-evaluation, 97750- Physical Performance Testing, 97110-Therapeutic exercises, 97530- Therapeutic activity, [id number]- Neuromuscular re-education, 97535- Self Care, [id number]- Manual therapy, [id number]- Gait training, [phone]- Electrical stimulation (unattended), 97016- Vasopneumatic device, 97035- Ultrasound, 02966- Ionotophoresis 4mg /ml Dexamethasone, Patient/Family  education, Balance training, Stair training, Taping, Joint mobilization, Cryotherapy, and Moist heat  PLAN FOR NEXT SESSION: add some more strengthening exercises for quads/hams/calf/hip ABD and ext;   retro walking no support for balance   Anyeli Hockenbury, PT 11/23/2023, 11:33 AM  PROGRESS NOTE FOR INSURANCE AUTHORIZATION FOR MORE PT VISITS:  Date of referral: see referral Referring provider: Benjamine Primmer MD Referring diagnosis? R knee and R leg pain Treatment diagnosis? (if different than referring diagnosis) R knee pain, Gait abnormality, muscle weakness, R knee stiffness  What was this (referring dx) caused by? Other: unknown cause  Nature of Condition: Initial Onset (within last 3 months)   Laterality: Rt  Current Functional Measure Score: LEFS 11/80  Objective measurements identify impairments when they are compared to normal values, the uninvolved extremity, and prior level of function.  [x]  Yes  []  No  Objective assessment of functional ability: Severe functional limitations   Briefly describe symptoms:  Patient has been seen x 6 visits PT for R knee pain.   She is making good progress and is able to navigate steps today with reciprocal pattern going up and part of the time descending.   Pain has reduced to 2-3/10.   She has improved ROM by 10 degrees.  Strength has improved per MMT tables above.   She is still using walker in the community.   She has not met her goals for PT yet, and has rehab potential to continue to improve.   Insurance should go ahead and authorize the full number of visits that we are requesting without issue   How did symptoms start: insidiously  Average pain intensity:  Last 24 hours: 3/10 worst with WB RLE  Past week: same  How often does the pt experience symptoms? Frequently  How much have the symptoms interfered with usual daily activities? moderately  How has condition changed since care began at this facility? IMPROVED (SEE NOTE  ABOVE)  In general, how is the patients overall health? Very Good   BACK PAIN (STarT Back Screening Tool) No

## 2023-11-30 ENCOUNTER — Ambulatory Visit: Attending: Family Medicine | Admitting: Rehabilitation

## 2023-11-30 ENCOUNTER — Encounter: Payer: Self-pay | Admitting: Rehabilitation

## 2023-11-30 DIAGNOSIS — M25661 Stiffness of right knee, not elsewhere classified: Secondary | ICD-10-CM | POA: Insufficient documentation

## 2023-11-30 DIAGNOSIS — M6281 Muscle weakness (generalized): Secondary | ICD-10-CM | POA: Insufficient documentation

## 2023-11-30 DIAGNOSIS — M25561 Pain in right knee: Secondary | ICD-10-CM | POA: Diagnosis present

## 2023-11-30 DIAGNOSIS — R2689 Other abnormalities of gait and mobility: Secondary | ICD-10-CM | POA: Diagnosis present

## 2023-11-30 NOTE — Therapy (Signed)
 [name] LOWER EXTREMITY TREATMENT / PROGRESS NOTE  Progress Note Reporting Period 10/19/23 to 11/30/23  See note below for Objective Data and Assessment of Progress/Goals.      Patient Name: Kelly Dodson MRN: [id number] DOB:10/14/1934, 89 y.o., female Today's Date: 11/30/2023   END OF SESSION:  PT End of Session - 11/30/23 0942     Visit Number 7   7 minutes late   Date for Recertification  12/14/23    PT Start Time 0937    PT Stop Time 1015    PT Time Calculation (min) 38 min    Activity Tolerance Patient tolerated treatment well;No increased pain    Behavior During Therapy WFL for tasks assessed/performed             Past Medical History:  Diagnosis Date   Hypercholesteremia    Hypertension    History reviewed. No pertinent surgical history. Patient Active Problem List   Diagnosis Date Noted   Near syncope 10/31/2022    PCP: Benjamine Aland, MD   REFERRING PROVIDER: Benjamine Aland, MD   REFERRING DIAG: [phone] (ICD-10-CM) - Pain in right knee M79.661 (ICD-10-CM) - Pain in right lower leg  THERAPY DIAG:  Acute pain of right knee  Other abnormalities of gait and mobility  Stiffness of right knee, not elsewhere classified  RATIONALE FOR EVALUATION AND TREATMENT: Rehabilitation  ONSET DATE: 10/01/23  NEXT MD VISIT:    SUBJECTIVE:                                                                                                                                                                                                         SUBJECTIVE STATEMENT:   Patient doing ok.  Having a little R knee pain today.  Feels like it might be weather related, but understands OA.  Rates pain today 2/10.  States feels 75% improved.   EVAL:  Patient is an 89 y/o referred to PT from PCP for R knee and RLE pain.   Patient is accompanied by her dtr  today who gives a more detailed history as patient has some difficulty providing.   Dtr reports Acute onset right  lower leg pain today after a car trip of several hours; began around noon.  States they went to a funeral in the [place], [place] area and that when they stopped for a restroom break the patient had R knee pain as soon as she got out of the car.   They deny any twisting motion or fall.   Patient denies any prior falls.  States has never had any R knee pain in the past.   States pain continued for the duration of the day and only continued to worsen.   She was not wearing high heels and denies rolling her ankle, etc.  States pain just started for no apparent reason.  Never had it before.  She came to the ED here when she arrived home and had an Xray and D-dimer that were negative (no US  since no US  tech was available).  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  She has continued to have pain for the last 3 weeks that has severely limited her.   She saw her PCP recently and was referred to PT.   Patient is normally very active for 89 y/o.   She still works as an camera operator at [employer] and has never had any problem ambulating.   However, she is using a cane on arrival to the clinic and is very unsteady, limping heavily on the RLE  PAIN: Are you having pain? Yes: NPRS scale: 0/10 rest;  5/10 with WB positions Pain location: L knee Pain description: sharp, stabbing, aching Aggravating factors: walking Relieving factors: NWB positions with leg elevation  PERTINENT HISTORY:  h/o DVT RLE, HTN, hyperlipidemia, near syncope,   PRECAUTIONS: None  RED FLAGS: None  WEIGHT BEARING RESTRICTIONS: No  FALLS:  Has patient fallen in last 6 months? No  LIVING ENVIRONMENT: Lives with: lives alone Lives in: House/apartment Stairs: Yes: External: 1 steps; none Has following equipment at home: Single point cane  OCCUPATION: retired from driving school bus;  still works as a chief strategy officer  PLOF: Independent with gait  PATIENT GOALS: not hurt in my R knee   OBJECTIVE:  (objective measures completed at initial evaluation unless otherwise dated)  DIAGNOSTIC FINDINGS:  CLINICAL DATA:  Right lower leg pain.   EXAM: RIGHT TIBIA AND FIBULA - 2 VIEW   COMPARISON:  None Available.   FINDINGS: No acute bony abnormality. Specifically, no fracture, subluxation, or dislocation. No joint effusion in the right knee. Scattered soft tissue calcifications.   IMPRESSION: No acute bony abnormality.     Electronically Signed   By: Franky Crease M.D.   On: 10/01/2023 23:06  PATIENT SURVEYS:   Extreme difficulty/unable (0), Quite a bit of difficulty (1), Moderate difficulty (2), Little difficulty (3), No difficulty (4) Survey date:  11/30/2023  11/30/2023   Any of your usual work, housework or school activities 1 2  2. Usual hobbies, recreational or sporting activities 1 2  3. Getting into/out of the bath 1 2  4. Walking between rooms 1 2  5. Putting on socks/shoes 1 2  6. Squatting  1 1  7. Lifting an object, like a bag of groceries from the floor 1 1  8. Performing light activities around your home 1 2  9. Performing heavy activities around your home 0 0  10. Getting into/out of a car 1 2  11. Walking 2 blocks 0 2  12. Walking 1 mile 0 0  13. Going up/down 10 stairs (1 flight) 0 2  14. Standing for 1 hour 0 0  15.  sitting for 1 hour 1 4  16. Running on even ground 0 0  17. Running on uneven ground 0 0  18. Making sharp turns while running fast 0 0  19. Hopping  0 0  20. Rolling over in bed 1 2  Score total:  11 26  COGNITION: Overall cognitive status: Within functional limits for tasks assessed    SENSATION: WFL  EDEMA:  Circumferential:    Patellar:  LLE = 39 cm;    RLE = 41.5 cm  Mid calf LLE = 35 cm;  RLE = 37.5 cm   POSTURE:  No Significant postural limitations  PALPATION: Somewhat TTP around the medial joint lines  MUSCLE LENGTH: Hamstrings: Right SLR = 70 deg; Left SLR = 80 deg Hamstrings: mild tightness ITB: mild  tight Piriformis: mild tight  LOWER EXTREMITY ROM:  Active ROM Right eval Left eval RLE 11/23/23  Hip flexion     Hip extension     Hip abduction     Hip adduction     Hip internal rotation     Hip external rotation     Knee flexion 111 132 120  Knee extension -4 0 -2  Ankle dorsiflexion     Ankle plantarflexion     Ankle inversion     Ankle eversion      Blank rows not tested)  LOWER EXTREMITY MMT:  MMT Right eval Left eval R/L 11/09/23  Hip flexion 3+ 4- 4/4+  Hip extension     Hip abduction     Hip adduction     Hip internal rotation 4- 4+ 4/4+  Hip external rotation 4+ 4+ 4+/4+  Knee flexion 4 5 5/5  Knee extension 4+ 5 5/5  Ankle dorsiflexion 5 5 5/4+  Ankle plantarflexion 4 4   Ankle inversion     Ankle eversion      (Blank rows = not tested)  LOWER EXTREMITY SPECIAL TESTS:  Knee special tests: Anterior drawer test: negative, Posterior drawer test: negative, Lachman Test: negative, and Pivot shift test: negative  FUNCTIONAL TESTS:  5 times sit to stand: 43.91 sec Timed up and go (TUG): 38.37  GAIT: Distance walked: into clinic x 200' Assistive device utilized: Single point cane Level of assistance: Min A Gait pattern: severe limp RLE, holds cane in the RUE incorrectly, needs min assist to maintain balance, holding walls with LUE coming into clinic today, very unsteady and unsafe;  high fall risk Comments:    TODAY'S TREATMENT:  11/30/23 THERAPEUTIC EXERCISE: To improve ROM.  Demonstration, verbal and tactile cues throughout for technique. NuStep L4 x 6'  PHYSICAL PERFORMANCE TEST or MEASUREMENT:   [facility] PT Assessment - 11/30/23 0001       Standardized Balance Assessment   Standardized Balance Assessment Dynamic Gait Index      Dynamic Gait Index   Level Surface Moderate Impairment    Change in Gait Speed Moderate Impairment    Gait with Horizontal Head Turns Moderate Impairment    Gait with Vertical Head Turns Normal    Gait and Pivot Turn  Mild Impairment    Step Over Obstacle Moderate Impairment    Step Around Obstacles Moderate Impairment    Steps Moderate Impairment    Total Score 11         Gait speed checked today without a device = 1.4 ft/sec  11/23/23 THERAPEUTIC EXERCISE: To improve ROM.  Demonstration, verbal and tactile cues throughout for technique. NuStep L4 x 6'  NEUROMUSCULAR RE-EDUCATION: To improve balance. IN CORNER/CHAIR IN FRONT:  Toe raises x 20    Heel raises x 20  Minisquat x 20   Marching x 20 BLE Step and Lunge at counter x 2/10 RLE with facilitation for posture/balance/amount of knee flexion  THERAPEUTIC ACTIVITIES: To improve functional performance.  Demonstration, verbal  and tactile cues throughout for technique. Up/down 1 flight of steps with R handrail reciprocal pattern for ascension;  intermittent reciprocal descending with CGA/SBA for foot placement and  eccentric knee control and balance Supine SLR x 2/10 RLE Supine QS x 20 RLE Supine HS x 20 RLE   11/16/23 THERAPEUTIC EXERCISE: To improve ROM.  Demonstration, verbal and tactile cues throughout for technique. NuStep L4 x 7'  THERAPEUTIC ACTIVITIES: To improve functional performance.  Demonstration, verbal and tactile cues throughout for technique. Resisted sidestepping RTB x 4 laps at counter Seated knee flexion RTB x 2/10 RLE Seated knee extension 3# w/ 2-3 sec holds x 20 RLE Forward lunge at counter x 2/10 RLE Rechecked LEFS  NEUROMUSCULAR RE-EDUCATION: To improve balance, coordination, and proprioception. Corner balance: NO UE support!  Toe raises x 20  Heel raises x 20  Minisquat x 20  March x 20 BLE  Tandem stance x 1' ea way  SLS x 10-15 sec x 4 trials each leg w/ CGA to min assist variably   11/09/23 Nustep L6x59min UELE LE strength test TUG- 20 seconds no AD- got confused with instructions toward the end 5xSTS- 15.66 sec Standing marching x 10 BLE Standing hip abduction x 10 BLE- cues for  posture Standing hip extension x 10 BLE Counter squat x 10   PATIENT EDUCATION:  Education details: HEP update--added mini lunge holding counter today Person educated: Patient Education method: Programmer, Multimedia, Demonstration, Verbal cues, Tactile cues, and Handouts Education comprehension: verbalized understanding, verbal cues required, tactile cues required, and needs further education  HOME EXERCISE PROGRAM: Access Code: [email or web address] URL: [email or web address] Date: 11/16/2023 Prepared by: Garnette Montclair  Exercises - Supine Ankle Pumps  - 1 x daily - 7 x weekly - 3 sets - 10 reps - Supine Heel Slides  - 1 x daily - 7 x weekly - 3 sets - 10 reps - Supine Short Arc Quad  - 1 x daily - 7 x weekly - 3 sets - 10 reps - Supine Straight Leg Raises  - 1 x daily - 7 x weekly - 1-2 sets - 10 reps - Standing Ankle Dorsiflexion with Table Support  - 1 x daily - 7 x weekly - 3 sets - 10 reps - Seated Hip Adduction Isometrics with Ball  - 1 x daily - 7 x weekly - 3 sets - 10 reps - Seated Hip Abduction with Resistance  - 1 x daily - 7 x weekly - 3 sets - 10 reps - Standing Heel Raises  - 1 x daily - 7 x weekly - 3 sets - 10 reps - Mini Squat with Counter Support  - 1 x daily - 7 x weekly - 3 sets - 10 reps - Forward Lunge with Back Leg Straight and Counter Support  - 1 x daily - 7 x weekly - 2 sets - 10 reps  ASSESSMENT:  CLINICAL IMPRESSION: Patient has been seen x 7 PT visits for R knee pain.  She has made very good progress, but has not yet attained the goals that we have set for her.  However, she has good rehab potential to meet her goals.  She is able to ambulate some without her walker now, but we continue to recommend she use it at all times in the community.   She reports she doesn't always use it at home, though.   R knee ROM and strength levels have mostly improved to where they need to be.   Balance is going to  be more of a focus going forward.   DGI and gait speed are  checked today and are well below age appropriate norms and indicate a high fall risk without a device.  DGI score is 11/24 indicating high fall risk,  and gait speed is 1.4 ft/sec which is well below normal community ambulator speed of 2.72ft/sec.   Patient goal is to ambulate with no device and not fall.   PT remains necessary for balance as well as residual pain, strength, ROM deficits.   Continue per POC  ADENA SIMA is a 89 y.o. female who was referred to physical therapy for evaluation and treatment for R knee and lower leg pain.    Patient reports onset of R knee pain beginning 10/01/23 during a car ride to [place], [place] to go to a funeral.   When she got out for a restroom break she began having R knee pain for no apparent reason.   No h/o trauma, fall, twist, etc.    Very high functioning patient.  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 Pain is worse with any WB position on the RLE.  She gets full relief when she sits down and rests.  The pain has really not gotten a great deal better since 10/01/23.   Her RLE is about 1 inch large than the LLE at the kne and at the calf.   She does have a h/o DVT.  However, her only pain is in the R knee with WB.   She has some crepitus with AROM, but no more than the L knee.   Patient has deficits in R knee ROM, R LE flexibility, RLE strength, and pain mainly in the R knee which are interfering with ADLs and are impacting quality of life.  On LEFS patient scored 11/80 demonstrating severe functional limitation.  Caelynn will benefit from skilled PT to address above deficits to improve mobility and activity tolerance with decreased pain interference.  OBJECTIVE IMPAIRMENTS: Abnormal gait, decreased ROM, decreased strength, and pain.   ACTIVITY LIMITATIONS: carrying, lifting, bending, standing, squatting, stairs, and locomotion level  PARTICIPATION LIMITATIONS: cleaning, laundry, shopping, community activity, and occupation  PERSONAL  FACTORS: Age, Time since onset of injury/illness/exacerbation, and 1-2 comorbidities: HTN,h/o RLE DVT, near syncope  are also affecting patient's functional outcome.   REHAB POTENTIAL: Good  CLINICAL DECISION MAKING: Evolving/moderate complexity  EVALUATION COMPLEXITY: Moderate   GOALS: Goals reviewed with patient? Yes  SHORT TERM GOALS: Target date: 11/16/2023   Patient will be independent with initial HEP. Baseline: 100% PT assist required for correct completion Goal status: MET- 11/23/23--able to teach back after review today  2.  Patient will report at least 25% improvement in R knee pain to improve QOL. Baseline: 7/10 worst Goal status: MET- 11/02/23   LONG TERM GOALS: Target date: 12/14/2023   Patient will be independent with advanced/ongoing HEP to improve outcomes and carryover.  Baseline: no advanced HEP yet 11/16/23:  progressing Goal status: IN PROGRESS  2.  Patient will report at least 50-75% improvement in R knee pain to improve QOL. Baseline: 7/10  worst Goal status: IN PROGRESS- 11/02/23   3.  Patient will demonstrate improved R knee AROM to >/= 0-125 deg to allow for normal gait and stair mechanics. Baseline: Refer to above LE ROM table 11/23/23:  -2 to 120 degrees Goal status: IN PROGRESS  4.  Patient will demonstrate improved RLE strength to >/= 5/5 for improved stability and ease of  mobility. Baseline: Refer to above LE MMT table Goal status: IN PROGRESS- 11/09/23  5.  Patient will be able to ambulate 600' with LRAD and normal gait pattern without increased pain to access community.  Baseline: pain with any ambulation 11/23/23:  less pain in the R knee per her subjective report Goal status: IN PROGRESS  6. Patient will be able to ascend/descend stairs with 1 HR and reciprocal step pattern safely to access home and community.  Baseline: unable to go reciprocally due to pain 11/23/23:  able to go up steps with reciprocal pattern w/ R handrail.   Able  to descend reciprocally about 30% of the time on a flight Goal status: IN PROGRESS  7.  Patient will report >/= 30/80 on LEFS (MCID = 9 pts) to demonstrate improved functional ability. Baseline: 11 11/16/23:  26/80 Goal status: IN PROGRESS  8.  Patient will demonstrate at least 19/24 on DGI to decrease risk of falls. Baseline: 11/30/23:  11/24 Goal status: IN PROGRESS   9.  Patient will improve on TUG score to </= 18 sec to reduce fall risk and function normally in the community Baseline: 38.37 Goal status: IN PROGRESS- 11/09/23   10.  Patient will improve on 5X sit to stand test to </= 20 sec to have functional R knee strength   Baseline:  43 sec   Goal Status:  11/09/23 met  PLAN:  PT FREQUENCY: 1-2x/week  PT DURATION: 8 weeks  PLANNED INTERVENTIONS: 97164- PT Re-evaluation, 97750- Physical Performance Testing, 97110-Therapeutic exercises, 97530- Therapeutic activity, [id number]- Neuromuscular re-education, 97535- Self Care, [id number]- Manual therapy, [id number]- Gait training, [phone]- Electrical stimulation (unattended), 97016- Vasopneumatic device, [id number]- Ultrasound, 02966- Ionotophoresis 4mg /ml Dexamethasone, Patient/Family education, Balance training, Stair training, Taping, Joint mobilization, Cryotherapy, and Moist heat  PLAN FOR NEXT SESSION: add some more strengthening exercises for quads/hams/calf/hip ABD and ext;   retro walking no support for balance;  check ABC, LEFS, TUG.     Katana Berthold, PT 11/30/2023, 10:24 AM  PROGRESS NOTE FOR INSURANCE AUTHORIZATION FOR MORE PT VISITS:  Date of referral: see referral Referring provider: Benjamine Primmer MD Referring diagnosis? R knee and R leg pain Treatment diagnosis? (if different than referring diagnosis) R knee pain, Gait abnormality, muscle weakness, R knee stiffness  What was this (referring dx) caused by? Other: unknown cause  Nature of Condition: Initial Onset (within last 3 months)   Laterality: Rt  Current Functional  Measure Score: LEFS 11/80  Objective measurements identify impairments when they are compared to normal values, the uninvolved extremity, and prior level of function.  [x]  Yes  []  No  Objective assessment of functional ability: Severe functional limitations   Briefly describe symptoms:  Patient has been seen x 6 visits PT for R knee pain.   She is making good progress and is able to navigate steps today with reciprocal pattern going up and part of the time descending.   Pain has reduced to 2-3/10.   She has improved ROM by 10 degrees.  Strength has improved per MMT tables above.   She is still using walker in the community.   She has not met her goals for PT yet, and has rehab potential to continue to improve.   Insurance should go ahead and authorize the full number of visits that we are requesting without issue   How did symptoms start: insidiously  Average pain intensity:  Last 24 hours: 3/10 worst with WB RLE  Past week: same  How often does the pt experience  symptoms? Frequently  How much have the symptoms interfered with usual daily activities? moderately  How has condition changed since care began at this facility? IMPROVED (SEE NOTE ABOVE)  In general, how is the patients overall health? Very Good   BACK PAIN (STarT Back Screening Tool) No

## 2023-12-07 ENCOUNTER — Ambulatory Visit: Admitting: Rehabilitation

## 2023-12-07 ENCOUNTER — Encounter: Payer: Self-pay | Admitting: Rehabilitation

## 2023-12-07 DIAGNOSIS — M25661 Stiffness of right knee, not elsewhere classified: Secondary | ICD-10-CM

## 2023-12-07 DIAGNOSIS — R2689 Other abnormalities of gait and mobility: Secondary | ICD-10-CM

## 2023-12-07 DIAGNOSIS — M25561 Pain in right knee: Secondary | ICD-10-CM | POA: Diagnosis not present

## 2023-12-07 DIAGNOSIS — M6281 Muscle weakness (generalized): Secondary | ICD-10-CM

## 2023-12-07 NOTE — Therapy (Signed)
 [name] LOWER EXTREMITY TREATMENT / PROGRESS NOTE  Progress Note Reporting Period 10/19/23 to 11/30/23  See note below for Objective Data and Assessment of Progress/Goals.      Patient Name: Kelly Dodson MRN: [id number] DOB:14-Feb-1934, 89 y.o., female Today's Date: 12/07/2023   END OF SESSION:  PT End of Session - 12/07/23 0942     Visit Number 8    Date for Recertification  12/14/23    PT Start Time 0935    PT Stop Time 1025    PT Time Calculation (min) 50 min    Activity Tolerance Patient tolerated treatment well;No increased pain    Behavior During Therapy WFL for tasks assessed/performed             Past Medical History:  Diagnosis Date   Hypercholesteremia    Hypertension    History reviewed. No pertinent surgical history. Patient Active Problem List   Diagnosis Date Noted   Near syncope 10/31/2022    PCP: Benjamine Aland, MD   REFERRING PROVIDER: Benjamine Aland, MD   REFERRING DIAG: [phone] (ICD-10-CM) - Pain in right knee M79.661 (ICD-10-CM) - Pain in right lower leg  THERAPY DIAG:  Acute pain of right knee  Other abnormalities of gait and mobility  Stiffness of right knee, not elsewhere classified  Muscle weakness (generalized)  RATIONALE FOR EVALUATION AND TREATMENT: Rehabilitation  ONSET DATE: 10/01/23  NEXT MD VISIT:    SUBJECTIVE:                                                                                                                                                                                                         SUBJECTIVE STATEMENT:   Patient reports she is doing better.   She is pleased with her progress to date and feels she can D/C next visit  EVAL:  Patient is an 89 y/o referred to PT from PCP for R knee and RLE pain.   Patient is accompanied by her dtr  today who gives a more detailed history as patient has some difficulty providing.   Dtr reports Acute onset right lower leg pain today after a car  trip of several hours; began around noon.  States they went to a funeral in the [place], [place] area and that when they stopped for a restroom break the patient had R knee pain as soon as she got out of the car.   They deny any twisting motion or fall.   Patient denies any prior falls.   States has never had any R knee pain  in the past.   States pain continued for the duration of the day and only continued to worsen.   She was not wearing high heels and denies rolling her ankle, etc.  States pain just started for no apparent reason.  Never had it before.  She came to the ED here when she arrived home and had an Xray and D-dimer that were negative (no US  since no US  tech was available).  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  She has continued to have pain for the last 3 weeks that has severely limited her.   She saw her PCP recently and was referred to PT.   Patient is normally very active for 89 y/o.   She still works as an camera operator at [employer] and has never had any problem ambulating.   However, she is using a cane on arrival to the clinic and is very unsteady, limping heavily on the RLE  PAIN: Are you having pain? Yes: NPRS scale: 0/10 rest;  5/10 with WB positions Pain location: L knee Pain description: sharp, stabbing, aching Aggravating factors: walking Relieving factors: NWB positions with leg elevation  PERTINENT HISTORY:  h/o DVT RLE, HTN, hyperlipidemia, near syncope,   PRECAUTIONS: None  RED FLAGS: None  WEIGHT BEARING RESTRICTIONS: No  FALLS:  Has patient fallen in last 6 months? No  LIVING ENVIRONMENT: Lives with: lives alone Lives in: House/apartment Stairs: Yes: External: 1 steps; none Has following equipment at home: Single point cane  OCCUPATION: retired from driving school bus;  still works as a chief strategy officer  PLOF: Independent with gait  PATIENT GOALS: not hurt in my R knee   OBJECTIVE: (objective measures completed at  initial evaluation unless otherwise dated)  DIAGNOSTIC FINDINGS:  CLINICAL DATA:  Right lower leg pain.   EXAM: RIGHT TIBIA AND FIBULA - 2 VIEW   COMPARISON:  None Available.   FINDINGS: No acute bony abnormality. Specifically, no fracture, subluxation, or dislocation. No joint effusion in the right knee. Scattered soft tissue calcifications.   IMPRESSION: No acute bony abnormality.     Electronically Signed   By: Franky Crease M.D.   On: 10/01/2023 23:06  PATIENT SURVEYS:   Extreme difficulty/unable (0), Quite a bit of difficulty (1), Moderate difficulty (2), Little difficulty (3), No difficulty (4) Survey date:  12/07/2023  12/07/2023  12/07/23  Any of your usual work, housework or school activities 1 2 2   2. Usual hobbies, recreational or sporting activities 1 2 1   3. Getting into/out of the bath 1 2 4   4. Walking between rooms 1 2 3   5. Putting on socks/shoes 1 2 3   6. Squatting  1 1 3   7. Lifting an object, like a bag of groceries from the floor 1 1 2   8. Performing light activities around your home 1 2 2   9. Performing heavy activities around your home 0 0 0  10. Getting into/out of a car 1 2 3   11. Walking 2 blocks 0 2 2  12. Walking 1 mile 0 0 0  13. Going up/down 10 stairs (1 flight) 0 2 2  14. Standing for 1 hour 0 0 2  15.  sitting for 1 hour 1 4 4   16. Running on even ground 0 0 0  17. Running on uneven ground 0 0 0  18. Making sharp turns while running fast 0 0 0  19. Hopping  0 0 0  20. Rolling over  in bed 1 2 4   Score total:  11 26 37    ABC scale: The Activities-Specific Balance Confidence (ABC) Scale 0% 10 20 30  40 50 60 70 80 90 100% No confidence<->completely confident  "How confident are you that you will not lose your balance or become unsteady when you . . .   Date tested 12/07/2023   Walk around the house 60%  2. Walk up or down stairs 50%  3. Bend over and pick up a slipper from in front of a closet floor 50%  4. Reach for a small  can off a shelf at eye level 50%  5. Stand on tip toes and reach for something above your head 30%  6. Stand on a chair and reach for something 0%  7. Sweep the floor 60%  8. Walk outside the house to a car parked in the driveway 60%  9. Get into or out of a car 60%  10. Walk across a parking lot to the mall 60%  11. Walk up or down a ramp 60%  12. Walk in a crowded mall where people rapidly walk past you 60%  13. Are bumped into by people as you walk through the mall 0%  14. Step onto or off of an escalator while you are holding onto the railing 0%  15. Step onto or off an escalator while holding onto parcels such that you cannot hold onto the railing 0%  16. Walk outside on icy sidewalks 0%  Total: #/16 600/1600 = 37.5%       COGNITION: Overall cognitive status: Within functional limits for tasks assessed    SENSATION: WFL  EDEMA:  Circumferential:    Patellar:  LLE = 39 cm;    RLE = 41.5 cm  Mid calf LLE = 35 cm;  RLE = 37.5 cm   POSTURE:  No Significant postural limitations  PALPATION: Somewhat TTP around the medial joint lines  MUSCLE LENGTH: Hamstrings: Right SLR = 70 deg; Left SLR = 80 deg Hamstrings: mild tightness ITB: mild tight Piriformis: mild tight  LOWER EXTREMITY ROM:  Active ROM Right eval Left eval RLE 11/23/23  Hip flexion     Hip extension     Hip abduction     Hip adduction     Hip internal rotation     Hip external rotation     Knee flexion 111 132 120  Knee extension -4 0 -2  Ankle dorsiflexion     Ankle plantarflexion     Ankle inversion     Ankle eversion      Blank rows not tested)  LOWER EXTREMITY MMT:  MMT Right eval Left eval R/L 11/09/23  Hip flexion 3+ 4- 4/4+  Hip extension     Hip abduction     Hip adduction     Hip internal rotation 4- 4+ 4/4+  Hip external rotation 4+ 4+ 4+/4+  Knee flexion 4 5 5/5  Knee extension 4+ 5 5/5  Ankle dorsiflexion 5 5 5/4+  Ankle plantarflexion 4 4   Ankle inversion     Ankle  eversion      (Blank rows = not tested)  LOWER EXTREMITY SPECIAL TESTS:  Knee special tests: Anterior drawer test: negative, Posterior drawer test: negative, Lachman Test: negative, and Pivot shift test: negative  FUNCTIONAL TESTS:  5 times sit to stand: 43.91 sec Timed up and go (TUG): 38.37  GAIT: Distance walked: into clinic x 200' Assistive device utilized: Single point cane  Level of assistance: Min A Gait pattern: severe limp RLE, holds cane in the RUE incorrectly, needs min assist to maintain balance, holding walls with LUE coming into clinic today, very unsteady and unsafe;  high fall risk Comments:    TODAY'S TREATMENT:  12/07/23 THERAPEUTIC EXERCISE: To improve ROM.  Demonstration, verbal and tactile cues throughout for technique. NuStep L4 x 7'  THERAPEUTIC ACTIVITIES: To improve functional performance.  Demonstration, verbal and tactile cues throughout for technique. Seated LAQ 4# w/ 3 sec holds x 20 RLE Seated march 4# x 20 RLE Standing march 4# x 20 RLE Standing hip abd 4# x 20 RLE Standing hip ext 4# x 20 Rle Standing knee flexion 4# x 20 RLE Standing at counter 1UE support w/ forward lunge x 10 BLE;  lateral lunges x 10 BLE   11/30/23 THERAPEUTIC EXERCISE: To improve ROM.  Demonstration, verbal and tactile cues throughout for technique. NuStep L4 x 6'  PHYSICAL PERFORMANCE TEST or MEASUREMENT:    Gait speed checked today without a device = 1.4 ft/sec  11/23/23 THERAPEUTIC EXERCISE: To improve ROM.  Demonstration, verbal and tactile cues throughout for technique. NuStep L4 x 6'  NEUROMUSCULAR RE-EDUCATION: To improve balance. IN CORNER/CHAIR IN FRONT:  Toe raises x 20    Heel raises x 20  Minisquat x 20   Marching x 20 BLE Step and Lunge at counter x 2/10 RLE with facilitation for posture/balance/amount of knee flexion  THERAPEUTIC ACTIVITIES: To improve functional performance.  Demonstration, verbal and tactile cues throughout for  technique. Up/down 1 flight of steps with R handrail reciprocal pattern for ascension;  intermittent reciprocal descending with CGA/SBA for foot placement and  eccentric knee control and balance Supine SLR x 2/10 RLE Supine QS x 20 RLE Supine HS x 20 RLE   11/16/23 THERAPEUTIC EXERCISE: To improve ROM.  Demonstration, verbal and tactile cues throughout for technique. NuStep L4 x 7'  THERAPEUTIC ACTIVITIES: To improve functional performance.  Demonstration, verbal and tactile cues throughout for technique. Resisted sidestepping RTB x 4 laps at counter Seated knee flexion RTB x 2/10 RLE Seated knee extension 3# w/ 2-3 sec holds x 20 RLE Forward lunge at counter x 2/10 RLE Rechecked LEFS  NEUROMUSCULAR RE-EDUCATION: To improve balance, coordination, and proprioception. Corner balance: NO UE support!  Toe raises x 20  Heel raises x 20  Minisquat x 20  March x 20 BLE  Tandem stance x 1' ea way  SLS x 10-15 sec x 4 trials each leg w/ CGA to min assist variably   11/09/23 Nustep L6x35min UELE LE strength test TUG- 20 seconds no AD- got confused with instructions toward the end 5xSTS- 15.66 sec Standing marching x 10 BLE Standing hip abduction x 10 BLE- cues for posture Standing hip extension x 10 BLE Counter squat x 10   PATIENT EDUCATION:  Education details: HEP update--added mini lunge holding counter today Person educated: Patient Education method: Programmer, Multimedia, Demonstration, Verbal cues, Tactile cues, and Handouts Education comprehension: verbalized understanding, verbal cues required, tactile cues required, and needs further education  HOME EXERCISE PROGRAM: Access Code: [email or web address] URL: [email or web address] Date: 11/16/2023 Prepared by: Garnette Montclair  Exercises - Supine Ankle Pumps  - 1 x daily - 7 x weekly - 3 sets - 10 reps - Supine Heel Slides  - 1 x daily - 7 x weekly - 3 sets - 10 reps - Supine Short Arc Quad  - 1 x daily - 7 x weekly - 3 sets  - 10 reps -  Supine Straight Leg Raises  - 1 x daily - 7 x weekly - 1-2 sets - 10 reps - Standing Ankle Dorsiflexion with Table Support  - 1 x daily - 7 x weekly - 3 sets - 10 reps - Seated Hip Adduction Isometrics with Ball  - 1 x daily - 7 x weekly - 3 sets - 10 reps - Seated Hip Abduction with Resistance  - 1 x daily - 7 x weekly - 3 sets - 10 reps - Standing Heel Raises  - 1 x daily - 7 x weekly - 3 sets - 10 reps - Mini Squat with Counter Support  - 1 x daily - 7 x weekly - 3 sets - 10 reps - Forward Lunge with Back Leg Straight and Counter Support  - 1 x daily - 7 x weekly - 2 sets - 10 reps  ASSESSMENT:  CLINICAL IMPRESSION: Patient is doing well overall.  Rechecked TUG test today and patient improved by 13 sec from initial evaluation visit.  LEFS and ABC have also improved.  She is nearing max rehab potential and D/C is planned for next visit.   Patient is in agreement with this plan.  Reviewed some of her HEP today and she does not have good recall due to forgetfulness.   Hopefully she will be able to follow thru on it.   She states she has all of the written handouts at home on her kitchen table and that she does them daily, but can't remember them unless she has the papers in front of her.    Kelly Dodson is a 89 y.o. female who was referred to physical therapy for evaluation and treatment for R knee and lower leg pain.    Patient reports onset of R knee pain beginning 10/01/23 during a car ride to [place], [place] to go to a funeral.   When she got out for a restroom break she began having R knee pain for no apparent reason.   No h/o trauma, fall, twist, etc.    Very high functioning patient.  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 Pain is worse with any WB position on the RLE.  She gets full relief when she sits down and rests.  The pain has really not gotten a great deal better since 10/01/23.   Her RLE is about 1 inch large than the LLE at the kne and at the  calf.   She does have a h/o DVT.  However, her only pain is in the R knee with WB.   She has some crepitus with AROM, but no more than the L knee.   Patient has deficits in R knee ROM, R LE flexibility, RLE strength, and pain mainly in the R knee which are interfering with ADLs and are impacting quality of life.  On LEFS patient scored 11/80 demonstrating severe functional limitation.  Neaveh will benefit from skilled PT to address above deficits to improve mobility and activity tolerance with decreased pain interference.  OBJECTIVE IMPAIRMENTS: Abnormal gait, decreased ROM, decreased strength, and pain.   ACTIVITY LIMITATIONS: carrying, lifting, bending, standing, squatting, stairs, and locomotion level  PARTICIPATION LIMITATIONS: cleaning, laundry, shopping, community activity, and occupation  PERSONAL FACTORS: Age, Time since onset of injury/illness/exacerbation, and 1-2 comorbidities: HTN,h/o RLE DVT, near syncope  are also affecting patient's functional outcome.   REHAB POTENTIAL: Good  CLINICAL DECISION MAKING: Evolving/moderate complexity  EVALUATION COMPLEXITY: Moderate   GOALS: Goals reviewed with  patient? Yes  SHORT TERM GOALS: Target date: 11/16/2023   Patient will be independent with initial HEP. Baseline: 100% PT assist required for correct completion Goal status: MET- 11/23/23--able to teach back after review today  2.  Patient will report at least 25% improvement in R knee pain to improve QOL. Baseline: 7/10 worst Goal status: MET- 11/02/23   LONG TERM GOALS: Target date: 12/14/2023   Patient will be independent with advanced/ongoing HEP to improve outcomes and carryover.  Baseline: no advanced HEP yet 11/16/23:  progressing Goal status: IN PROGRESS  2.  Patient will report at least 50-75% improvement in R knee pain to improve QOL. Baseline: 7/10  worst Goal status: IN PROGRESS- 11/02/23   3.  Patient will demonstrate improved R knee AROM to >/= 0-125 deg to  allow for normal gait and stair mechanics. Baseline: Refer to above LE ROM table 11/23/23:  -2 to 120 degrees Goal status: IN PROGRESS  4.  Patient will demonstrate improved RLE strength to >/= 5/5 for improved stability and ease of mobility. Baseline: Refer to above LE MMT table Goal status: IN PROGRESS- 11/09/23  5.  Patient will be able to ambulate 600' with LRAD and normal gait pattern without increased pain to access community.  Baseline: pain with any ambulation 11/23/23:  less pain in the R knee per her subjective report 12/07/23:  less pain Goal status: MET  6. Patient will be able to ascend/descend stairs with 1 HR and reciprocal step pattern safely to access home and community.  Baseline: unable to go reciprocally due to pain 11/23/23:  able to go up steps with reciprocal pattern w/ R handrail.   Able to descend reciprocally about 30% of the time on a flight Goal status: IN PROGRESS  7.  Patient will report >/= 30/80 on LEFS (MCID = 9 pts) to demonstrate improved functional ability. Baseline: 11 11/16/23:  26/80 12/07/23:  37/80 scores are using walker Goal status: MET  8.  Patient will demonstrate at least 19/24 on DGI to decrease risk of falls. Baseline: 11/30/23:  11/24 Goal status: IN PROGRESS   9.  Patient will improve on TUG score to </= 18 sec to reduce fall risk and function normally in the community Baseline: 38.37 12/07/23:  25.51 sec Goal status: IN PROGRESS   10.  Patient will improve on 5X sit to stand test to </= 20 sec to have functional R knee strength   Baseline:  43 sec   Goal Status:  11/09/23 met  PLAN:  PT FREQUENCY: 1-2x/week  PT DURATION: 8 weeks  PLANNED INTERVENTIONS: 97164- PT Re-evaluation, 97750- Physical Performance Testing, 97110-Therapeutic exercises, 97530- Therapeutic activity, [id number]- Neuromuscular re-education, 97535- Self Care, [id number]- Manual therapy, [id number]- Gait training, [phone]- Electrical stimulation (unattended), 97016-  Vasopneumatic device, 97035- Ultrasound, 02966- Ionotophoresis 4mg /ml Dexamethasone, Patient/Family education, Balance training, Stair training, Taping, Joint mobilization, Cryotherapy, and Moist heat  PLAN FOR NEXT SESSION:  check strength, check stairs, review HEP, D/C PT next visit  Taylin Mans, PT 12/07/2023, 10:47 AM  PROGRESS NOTE FOR INSURANCE AUTHORIZATION FOR MORE PT VISITS:  Date of referral: see referral Referring provider: Benjamine Primmer MD Referring diagnosis? R knee and R leg pain Treatment diagnosis? (if different than referring diagnosis) R knee pain, Gait abnormality, muscle weakness, R knee stiffness  What was this (referring dx) caused by? Other: unknown cause  Nature of Condition: Initial Onset (within last 3 months)   Laterality: Rt  Current Functional Measure Score: LEFS 11/80  Objective measurements identify impairments  when they are compared to normal values, the uninvolved extremity, and prior level of function.  [x]  Yes  []  No  Objective assessment of functional ability: Severe functional limitations   Briefly describe symptoms:  Patient has been seen x 6 visits PT for R knee pain.   She is making good progress and is able to navigate steps today with reciprocal pattern going up and part of the time descending.   Pain has reduced to 2-3/10.   She has improved ROM by 10 degrees.  Strength has improved per MMT tables above.   She is still using walker in the community.   She has not met her goals for PT yet, and has rehab potential to continue to improve.   Insurance should go ahead and authorize the full number of visits that we are requesting without issue   How did symptoms start: insidiously  Average pain intensity:  Last 24 hours: 3/10 worst with WB RLE  Past week: same  How often does the pt experience symptoms? Frequently  How much have the symptoms interfered with usual daily activities? moderately  How has condition changed since care  began at this facility? IMPROVED (SEE NOTE ABOVE)  In general, how is the patients overall health? Very Good   BACK PAIN (STarT Back Screening Tool) No

## 2023-12-14 ENCOUNTER — Encounter: Payer: Self-pay | Admitting: Rehabilitation

## 2023-12-14 ENCOUNTER — Ambulatory Visit: Admitting: Rehabilitation

## 2023-12-14 DIAGNOSIS — M25661 Stiffness of right knee, not elsewhere classified: Secondary | ICD-10-CM

## 2023-12-14 DIAGNOSIS — M6281 Muscle weakness (generalized): Secondary | ICD-10-CM

## 2023-12-14 DIAGNOSIS — M25561 Pain in right knee: Secondary | ICD-10-CM

## 2023-12-14 DIAGNOSIS — R2689 Other abnormalities of gait and mobility: Secondary | ICD-10-CM

## 2023-12-14 NOTE — Therapy (Signed)
 [name] LOWER EXTREMITY TREATMENT / DC SUMMARY   Patient Name: Kelly Dodson MRN: [id number] DOB:[birth year], 89 y.o., female Today's Date: 12/14/2023   END OF SESSION:  PT End of Session - 12/14/23 0952     Visit Number 9    Date for Recertification  12/14/23    PT Start Time 0940   10 min late   PT Stop Time 1018    PT Time Calculation (min) 38 min    Activity Tolerance Patient tolerated treatment well;No increased pain    Behavior During Therapy WFL for tasks assessed/performed             Past Medical History:  Diagnosis Date   Hypercholesteremia    Hypertension    History reviewed. No pertinent surgical history. Patient Active Problem List   Diagnosis Date Noted   Near syncope 10/31/2022    PCP: Benjamine Aland, MD   REFERRING PROVIDER: Benjamine Aland, MD   REFERRING DIAG: [phone] (ICD-10-CM) - Pain in right knee M79.661 (ICD-10-CM) - Pain in right lower leg  THERAPY DIAG:  Acute pain of right knee  Other abnormalities of gait and mobility  Stiffness of right knee, not elsewhere classified  Muscle weakness (generalized)  RATIONALE FOR EVALUATION AND TREATMENT: Rehabilitation  ONSET DATE: 10/01/23  NEXT MD VISIT:    SUBJECTIVE:                                                                                                                                                                                                         SUBJECTIVE STATEMENT:   Patient reports she is doing fine.   Still having some pain, but not as bad as initially.   She feels pleased with her progress and is ready for D/C  EVAL:  Patient is an 89 y/o referred to PT from PCP for R knee and RLE pain.   Patient is accompanied by her dtr  today who gives a more detailed history as patient has some difficulty providing.   Dtr reports Acute onset right lower leg pain today after a car trip of several hours; began around noon.  States they went to a funeral in the  [place], [place] area and that when they stopped for a restroom break the patient had R knee pain as soon as she got out of the car.   They deny any twisting motion or fall.   Patient denies any prior falls.   States has never had any R knee pain in the past.   States pain continued for the  duration of the day and only continued to worsen.   She was not wearing high heels and denies rolling her ankle, etc.  States pain just started for no apparent reason.  Never had it before.  She came to the ED here when she arrived home and had an Xray and D-dimer that were negative (no US  since no US  tech was available).  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  She has continued to have pain for the last 3 weeks that has severely limited her.   She saw her PCP recently and was referred to PT.   Patient is normally very active for 89 y/o.   She still works as an camera operator at [employer] and has never had any problem ambulating.   However, she is using a cane on arrival to the clinic and is very unsteady, limping heavily on the RLE  PAIN: Are you having pain? Yes: NPRS scale: 0/10 rest;  5/10 with WB positions Pain location: L knee Pain description: sharp, stabbing, aching Aggravating factors: walking Relieving factors: NWB positions with leg elevation  PERTINENT HISTORY:  h/o DVT RLE, HTN, hyperlipidemia, near syncope,   PRECAUTIONS: None  RED FLAGS: None  WEIGHT BEARING RESTRICTIONS: No  FALLS:  Has patient fallen in last 6 months? No  LIVING ENVIRONMENT: Lives with: lives alone Lives in: House/apartment Stairs: Yes: External: 1 steps; none Has following equipment at home: Single point cane  OCCUPATION: retired from driving school bus;  still works as a chief strategy officer  PLOF: Independent with gait  PATIENT GOALS: not hurt in my R knee   OBJECTIVE: (objective measures completed at initial evaluation unless otherwise dated)  DIAGNOSTIC FINDINGS:  CLINICAL  DATA:  Right lower leg pain.   EXAM: RIGHT TIBIA AND FIBULA - 2 VIEW   COMPARISON:  None Available.   FINDINGS: No acute bony abnormality. Specifically, no fracture, subluxation, or dislocation. No joint effusion in the right knee. Scattered soft tissue calcifications.   IMPRESSION: No acute bony abnormality.     Electronically Signed   By: Franky Crease M.D.   On: 10/01/2023 23:06  PATIENT SURVEYS:   Extreme difficulty/unable (0), Quite a bit of difficulty (1), Moderate difficulty (2), Little difficulty (3), No difficulty (4) Survey date:  12/14/2023  12/14/2023  12/07/23  Any of your usual work, housework or school activities 1 2 2   2. Usual hobbies, recreational or sporting activities 1 2 1   3. Getting into/out of the bath 1 2 4   4. Walking between rooms 1 2 3   5. Putting on socks/shoes 1 2 3   6. Squatting  1 1 3   7. Lifting an object, like a bag of groceries from the floor 1 1 2   8. Performing light activities around your home 1 2 2   9. Performing heavy activities around your home 0 0 0  10. Getting into/out of a car 1 2 3   11. Walking 2 blocks 0 2 2  12. Walking 1 mile 0 0 0  13. Going up/down 10 stairs (1 flight) 0 2 2  14. Standing for 1 hour 0 0 2  15.  sitting for 1 hour 1 4 4   16. Running on even ground 0 0 0  17. Running on uneven ground 0 0 0  18. Making sharp turns while running fast 0 0 0  19. Hopping  0 0 0  20. Rolling over in bed 1 2 4   Score total:  11  26 37    ABC scale: The Activities-Specific Balance Confidence (ABC) Scale 0% 10 20 30  40 50 60 70 80 90 100% No confidence<->completely confident  "How confident are you that you will not lose your balance or become unsteady when you . . .   Date tested 12/14/2023   Walk around the house 60%  2. Walk up or down stairs 50%  3. Bend over and pick up a slipper from in front of a closet floor 50%  4. Reach for a small can off a shelf at eye level 50%  5. Stand on tip toes and reach for something  above your head 30%  6. Stand on a chair and reach for something 0%  7. Sweep the floor 60%  8. Walk outside the house to a car parked in the driveway 60%  9. Get into or out of a car 60%  10. Walk across a parking lot to the mall 60%  11. Walk up or down a ramp 60%  12. Walk in a crowded mall where people rapidly walk past you 60%  13. Are bumped into by people as you walk through the mall 0%  14. Step onto or off of an escalator while you are holding onto the railing 0%  15. Step onto or off an escalator while holding onto parcels such that you cannot hold onto the railing 0%  16. Walk outside on icy sidewalks 0%  Total: #/16 600/1600 = 37.5%       COGNITION: Overall cognitive status: Within functional limits for tasks assessed    SENSATION: WFL  EDEMA:  Circumferential:    Patellar:  LLE = 39 cm;    RLE = 41.5 cm  Mid calf LLE = 35 cm;  RLE = 37.5 cm   POSTURE:  No Significant postural limitations  PALPATION: Somewhat TTP around the medial joint lines  MUSCLE LENGTH: Hamstrings: Right SLR = 70 deg; Left SLR = 80 deg Hamstrings: mild tightness ITB: mild tight Piriformis: mild tight  LOWER EXTREMITY ROM:  Active ROM Right eval Left eval RLE 11/23/23 RLE 12/14/23  Hip flexion      Hip extension      Hip abduction      Hip adduction      Hip internal rotation      Hip external rotation      Knee flexion 111 132 120 110  Knee extension -4 0 -2 -2  Ankle dorsiflexion      Ankle plantarflexion      Ankle inversion      Ankle eversion       Blank rows not tested)  LOWER EXTREMITY MMT:  MMT Right eval Left eval R/L 11/09/23 RLE 12/14/23  Hip flexion 3+ 4- 4/4+   Hip extension      Hip abduction      Hip adduction      Hip internal rotation 4- 4+ 4/4+ 4+  Hip external rotation 4+ 4+ 4+/4+ 4+  Knee flexion 4 5 5/5 5  Knee extension 4+ 5 5/5 4  Ankle dorsiflexion 5 5 5/4+ 4+  Ankle plantarflexion 4 4    Ankle inversion      Ankle eversion       (Blank  rows = not tested)  LOWER EXTREMITY SPECIAL TESTS:  Knee special tests: Anterior drawer test: negative, Posterior drawer test: negative, Lachman Test: negative, and Pivot shift test: negative  FUNCTIONAL TESTS:  5 times sit to stand: 43.91 sec Timed up  and go (TUG): 38.37  GAIT: Distance walked: into clinic x 200' Assistive device utilized: Single point cane Level of assistance: Min A Gait pattern: severe limp RLE, holds cane in the RUE incorrectly, needs min assist to maintain balance, holding walls with LUE coming into clinic today, very unsteady and unsafe;  high fall risk Comments:    TODAY'S TREATMENT:  12/14/23 THERAPEUTIC EXERCISE: To improve ROM.  Demonstration, verbal and tactile cues throughout for technique. Bike L0 x 6'  THERAPEUTIC ACTIVITIES: To improve functional performance.  Demonstration, verbal and tactile cues throughout for technique. Seated knee extension 5# w/ 3 sec holds x 2/10 RLE Seated marching 5# x 2/10 RLE Standing hip flex 5# x 2.10 RLE Standing hip abd 5# x 2/10 RLE Standing hip ext 5# x 2/10 RLE Supine QS x 20 RLE Supine SLR x 20 RLE Supine hip abd x 20 RLE Recheck strength and ROM Up/down 1 flight of steps w/ education to use the L handrail.   Patient is independent and reciprocal pattern for ascent and descent  HEP is repeated today due to forgetfulness of patient, but she does well with recall  PATIENT EDUCATION:  Education details: HEP update--added mini lunge holding counter today Person educated: Patient Education method: Explanation, Demonstration, Verbal cues, Tactile cues, and Handouts Education comprehension: verbalized understanding, verbal cues required, tactile cues required, and needs further education  HOME EXERCISE PROGRAM: Access Code: [email or web address] URL: [email or web address] Date: 11/16/2023 Prepared by: Garnette Montclair  Exercises - Supine Ankle Pumps  - 1 x daily - 7 x weekly - 3 sets - 10 reps - Supine  Heel Slides  - 1 x daily - 7 x weekly - 3 sets - 10 reps - Supine Short Arc Quad  - 1 x daily - 7 x weekly - 3 sets - 10 reps - Supine Straight Leg Raises  - 1 x daily - 7 x weekly - 1-2 sets - 10 reps - Standing Ankle Dorsiflexion with Table Support  - 1 x daily - 7 x weekly - 3 sets - 10 reps - Seated Hip Adduction Isometrics with Ball  - 1 x daily - 7 x weekly - 3 sets - 10 reps - Seated Hip Abduction with Resistance  - 1 x daily - 7 x weekly - 3 sets - 10 reps - Standing Heel Raises  - 1 x daily - 7 x weekly - 3 sets - 10 reps - Mini Squat with Counter Support  - 1 x daily - 7 x weekly - 3 sets - 10 reps - Forward Lunge with Back Leg Straight and Counter Support  - 1 x daily - 7 x weekly - 2 sets - 10 reps  ASSESSMENT:  CLINICAL IMPRESSION: Patient has been seen x 9 PT visits for R knee OA.   She has made good progress and is ready for D/C.  She is still having occasional R knee pain, but reports that it is much less frequent and disabling in comparison to her initial visit with us .   We continue to recommend she use her walker in the community for safety and she seems to very compliant and happy to do this.   The walker has greatly reduced her fall risk as well as helping her knee pain.   She has improved in her RLE strength to >/= 4+/5.   ROM has remained limited to 110 degrees R knee flexion, but is not painful so 110 degrees is sufficient for her to function normally and  she is advised not to force it further.  TUG score and gait speed have improved though not to the goals we set for her, which is ok given her advanced age.   She is satisfied with her progress with PT and is ready for D/C.  She is advised that she may need periodic cortisone injections if her R knee pain gets worse and she knows to call to schedule appointments PRN.   She is able to teach back her kitchen sink exercises and is advised to continue with them daily as tolerated and call us  with any further questions.  D/C PT    Kelly Dodson is a 89 y.o. female who was referred to physical therapy for evaluation and treatment for R knee and lower leg pain.    Patient reports onset of R knee pain beginning 10/01/23 during a car ride to [place], [place] to go to a funeral.   When she got out for a restroom break she began having R knee pain for no apparent reason.   No h/o trauma, fall, twist, etc.    Very high functioning patient.  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 Pain is worse with any WB position on the RLE.  She gets full relief when she sits down and rests.  The pain has really not gotten a great deal better since 10/01/23.   Her RLE is about 1 inch large than the LLE at the kne and at the calf.   She does have a h/o DVT.  However, her only pain is in the R knee with WB.   She has some crepitus with AROM, but no more than the L knee.   Patient has deficits in R knee ROM, R LE flexibility, RLE strength, and pain mainly in the R knee which are interfering with ADLs and are impacting quality of life.  On LEFS patient scored 11/80 demonstrating severe functional limitation.  Kelly Dodson will benefit from skilled PT to address above deficits to improve mobility and activity tolerance with decreased pain interference.  OBJECTIVE IMPAIRMENTS: Abnormal gait, decreased ROM, decreased strength, and pain.   ACTIVITY LIMITATIONS: carrying, lifting, bending, standing, squatting, stairs, and locomotion level  PARTICIPATION LIMITATIONS: cleaning, laundry, shopping, community activity, and occupation  PERSONAL FACTORS: Age, Time since onset of injury/illness/exacerbation, and 1-2 comorbidities: HTN,h/o RLE DVT, near syncope  are also affecting patient's functional outcome.   REHAB POTENTIAL: Good  CLINICAL DECISION MAKING: Evolving/moderate complexity  EVALUATION COMPLEXITY: Moderate   GOALS: Goals reviewed with patient? Yes  SHORT TERM GOALS: Target date: 11/16/2023   Patient will be independent  with initial HEP. Baseline: 100% PT assist required for correct completion Goal status: MET- 11/23/23--able to teach back after review today  2.  Patient will report at least 25% improvement in R knee pain to improve QOL. Baseline: 7/10 worst Goal status: MET- 11/02/23   LONG TERM GOALS: Target date: 12/14/2023   Patient will be independent with advanced/ongoing HEP to improve outcomes and carryover.  Baseline: no advanced HEP yet 11/16/23:  progressing Goal status: MET--12/14/23   can teach back kitchen sink exercises  2.  Patient will report at least 50-75% improvement in R knee pain to improve QOL. Baseline: 7/10  worst Goal status: MET- 12/13/23:  rates 2/10 in the R knee  3.  Patient will demonstrate improved R knee AROM to >/= 0-125 deg to allow for normal gait and stair mechanics. Baseline: Refer to above LE ROM table  11/23/23:  -2 to 120 degrees Goal status: NOT MET  4.  Patient will demonstrate improved RLE strength to >/= 5/5 for improved stability and ease of mobility. Baseline: Refer to above LE MMT table Goal status: PARTIALLY MET-12/14/23  5.  Patient will be able to ambulate 600' with LRAD and normal gait pattern without increased pain to access community.  Baseline: pain with any ambulation 11/23/23:  less pain in the R knee per her subjective report 12/07/23:  less pain Goal status: MET  6. Patient will be able to ascend/descend stairs with 1 HR and reciprocal step pattern safely to access home and community.  Baseline: unable to go reciprocally due to pain 11/23/23:  able to go up steps with reciprocal pattern w/ R handrail.   Able to descend reciprocally about 30% of the time on a flight Goal status: MET--12/14/23:  able to ambulate 1 flight steps w/ L handrail reciprocally for ascending and descending with good balance and no pain c/o in the R knee  7.  Patient will report >/= 30/80 on LEFS (MCID = 9 pts) to demonstrate improved functional  ability. Baseline: 11 11/16/23:  26/80 12/07/23:  37/80 scores are using walker Goal status: MET  8.  Patient will demonstrate at least 19/24 on DGI to decrease risk of falls. Baseline: 11/30/23:  11/24 Goal status: IN PROGRESS   9.  Patient will improve on TUG score to </= 18 sec to reduce fall risk and function normally in the community Baseline: 38.37 12/07/23:  25.51 sec Goal status: IN PROGRESS   10.  Patient will improve on 5X sit to stand test to </= 20 sec to have functional R knee strength   Baseline:  43 sec   Goal Status:  11/09/23 met  PLAN:  PT FREQUENCY: 1-2x/week  PT DURATION: 8 weeks  PLANNED INTERVENTIONS: 97164- PT Re-evaluation, 97750- Physical Performance Testing, 97110-Therapeutic exercises, 97530- Therapeutic activity, [id number]- Neuromuscular re-education, 97535- Self Care, 02859- Manual therapy, [id number]- Gait training, [id number]- Electrical stimulation (unattended), 97016- Vasopneumatic device, 97035- Ultrasound, 02966- Ionotophoresis 4mg /ml Dexamethasone, Patient/Family education, Balance training, Stair training, Taping, Joint mobilization, Cryotherapy, and Moist heat  PLAN FOR NEXT SESSION: DC PT  PHYSICAL THERAPY DISCHARGE SUMMARY  Visits from Start of Care: 9   Current functional level related to goals / functional outcomes: SEE NOTE ABOVE IN ASSESSMENTS SECTION   Remaining deficits: Residual pain in the R knee, but it is intermittent and less frequent and not as severe   Education / Equipment: Patient is independent with all home exercises and advised to continue daily as tolerated and call us  with any questions   Patient agrees to discharge. Patient goals were met. Patient is being discharged due to maximized rehab potential.    Garnette Montclair, PT 12/14/2023, 10:44 AM  [facility] [address]  [address] [place], [place], [address] Phone: [phone]   Fax:  [phone]
